# Patient Record
Sex: Female | Born: 1985 | Hispanic: No | Marital: Married | Smoking: Never smoker
Health system: Southern US, Community
[De-identification: ages and names within clinical notes are randomized; demographics above are authoritative.]

## PROBLEM LIST (undated history)

## (undated) ENCOUNTER — Inpatient Hospital Stay (HOSPITAL_COMMUNITY): Payer: Self-pay

## (undated) DIAGNOSIS — D649 Anemia, unspecified: Secondary | ICD-10-CM

## (undated) DIAGNOSIS — E041 Nontoxic single thyroid nodule: Secondary | ICD-10-CM

## (undated) DIAGNOSIS — K219 Gastro-esophageal reflux disease without esophagitis: Secondary | ICD-10-CM

## (undated) DIAGNOSIS — D571 Sickle-cell disease without crisis: Secondary | ICD-10-CM

## (undated) DIAGNOSIS — D473 Essential (hemorrhagic) thrombocythemia: Secondary | ICD-10-CM

## (undated) DIAGNOSIS — D75839 Thrombocytosis, unspecified: Secondary | ICD-10-CM

## (undated) DIAGNOSIS — O1210 Gestational proteinuria, unspecified trimester: Secondary | ICD-10-CM

## (undated) DIAGNOSIS — N9081 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, unspecified: Secondary | ICD-10-CM

## (undated) HISTORY — DX: Gestational proteinuria, unspecified trimester: O12.10

## (undated) HISTORY — PX: VAGINA SURGERY: SHX829

## (undated) HISTORY — DX: 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, unspecified: N90.810

## (undated) HISTORY — DX: Nontoxic single thyroid nodule: E04.1

---
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
## 2014-07-25 ENCOUNTER — Inpatient Hospital Stay (HOSPITAL_COMMUNITY)
Admission: AD | Admit: 2014-07-25 | Discharge: 2014-07-25 | Disposition: A | Discharge status: Home / Self Care | Payer: Medicaid Other | Source: Ambulatory Visit | Attending: Obstetrics & Gynecology | Admitting: Obstetrics & Gynecology

## 2014-07-25 ENCOUNTER — Encounter (HOSPITAL_COMMUNITY): Payer: Self-pay | Admitting: *Deleted

## 2014-07-25 DIAGNOSIS — Z3A12 12 weeks gestation of pregnancy: Secondary | ICD-10-CM | POA: Diagnosis not present

## 2014-07-25 DIAGNOSIS — O9989 Other specified diseases and conditions complicating pregnancy, childbirth and the puerperium: Secondary | ICD-10-CM | POA: Insufficient documentation

## 2014-07-25 DIAGNOSIS — K219 Gastro-esophageal reflux disease without esophagitis: Secondary | ICD-10-CM | POA: Insufficient documentation

## 2014-07-25 DIAGNOSIS — N949 Unspecified condition associated with female genital organs and menstrual cycle: Secondary | ICD-10-CM | POA: Insufficient documentation

## 2014-07-25 DIAGNOSIS — O99611 Diseases of the digestive system complicating pregnancy, first trimester: Secondary | ICD-10-CM | POA: Diagnosis not present

## 2014-07-25 DIAGNOSIS — R1030 Lower abdominal pain, unspecified: Secondary | ICD-10-CM | POA: Diagnosis present

## 2014-07-25 HISTORY — DX: Gastro-esophageal reflux disease without esophagitis: K21.9

## 2014-07-25 LAB — WET PREP, GENITAL
CLUE CELLS WET PREP: NONE SEEN
Trich, Wet Prep: NONE SEEN
WBC, Wet Prep HPF POC: NONE SEEN
Yeast Wet Prep HPF POC: NONE SEEN

## 2014-07-25 LAB — URINALYSIS, ROUTINE W REFLEX MICROSCOPIC
BILIRUBIN URINE: NEGATIVE
Glucose, UA: NEGATIVE mg/dL
KETONES UR: NEGATIVE mg/dL
Leukocytes, UA: NEGATIVE
NITRITE: NEGATIVE
Protein, ur: NEGATIVE mg/dL
Urobilinogen, UA: 0.2 mg/dL (ref 0.0–1.0)
pH: 5.5 (ref 5.0–8.0)

## 2014-07-25 LAB — URINE MICROSCOPIC-ADD ON

## 2014-07-25 NOTE — Discharge Instructions (Signed)
Round Ligament Pain During Pregnancy Round ligament pain is a sharp pain or jabbing feeling often felt in the lower belly or groin area on one or both sides. It is one of the most common complaints during pregnancy and is considered a normal part of pregnancy. It is most often felt during the second trimester.  Here is what you need to know about round ligament pain, including some tips to help you feel better.  Causes of Round Ligament Pain  Several thick ligaments surround and support your womb (uterus) as it grows during pregnancy. One of them is called the round ligament.  The round ligament connects the front part of the womb to your groin, the area where your legs attach to your pelvis. The round ligament normally tightens and relaxes slowly.  As your baby and womb grow, the round ligament stretches. That makes it more likely to become strained.  Sudden movements can cause the ligament to tighten quickly, like a rubber band snapping. This causes a sudden and quick jabbing feeling.  Symptoms of Round Ligament Pain  Round ligament pain can be concerning and uncomfortable. But it is considered normal as your body changes during pregnancy.  The symptoms of round ligament pain include a sharp, sudden spasm in the belly. It usually affects the right side, but it may happen on both sides. The pain only lasts a few seconds.  Exercise may cause the pain, as will rapid movements such as:  sneezing coughing laughing rolling over in bed standing up too quickly  Treatment of Round Ligament Pain  Here are some tips that may help reduce your discomfort:  Pain relief. Take over-the-counter acetaminophen for pain, if necessary. Ask your doctor if this is OK.  Exercise. Get plenty of exercise to keep your stomach (core) muscles strong. Doing stretching exercises or prenatal yoga can be helpful. Ask your doctor which exercises are safe for you and your baby.  A helpful exercise involves  putting your hands and knees on the floor, lowering your head, and pushing your backside into the air.  Avoid sudden movements. Change positions slowly (such as standing up or sitting down) to avoid sudden movements that may cause stretching and pain.  Flex your hips. Bend and flex your hips before you cough, sneeze, or laugh to avoid pulling on the ligaments.  Apply warmth. A heating pad or warm bath may be helpful. Ask your doctor if this is OK. Extreme heat can be dangerous to the baby.  You should try to modify your daily activity level and avoid positions that may worsen the condition.  When to Call the Doctor/Midwife  Always tell your doctor or midwife about any type of pain you have during pregnancy. Round ligament pain is quick and doesn't last long.  Call your health care provider immediately if you have:  severe pain fever chills pain on urination difficulty walking  Belly pain during pregnancy can be due to many different causes. It is important for your doctor to rule out more serious conditions, including pregnancy complications such as placenta abruption or non-pregnancy illnesses such as:  inguinal hernia appendicitis stomach, liver, and kidney problems Preterm labor pains may sometimes be mistaken for round ligament pain. First Trimester of Pregnancy The first trimester of pregnancy is from week 1 until the end of week 12 (months 1 through 3). A week after a sperm fertilizes an egg, the egg will implant on the wall of the uterus. This embryo will begin to develop into  a baby. Genes from you and your partner are forming the baby. The female genes determine whether the baby is a boy or a girl. At 6-8 weeks, the eyes and face are formed, and the heartbeat can be seen on ultrasound. At the end of 12 weeks, all the baby's organs are formed.  Now that you are pregnant, you will want to do everything you can to have a healthy baby. Two of the most important things are to get good  prenatal care and to follow your health care provider's instructions. Prenatal care is all the medical care you receive before the baby's birth. This care will help prevent, find, and treat any problems during the pregnancy and childbirth. BODY CHANGES Your body goes through many changes during pregnancy. The changes vary from woman to woman.   You may gain or lose a couple of pounds at first.  You may feel sick to your stomach (nauseous) and throw up (vomit). If the vomiting is uncontrollable, call your health care provider.  You may tire easily.  You may develop headaches that can be relieved by medicines approved by your health care provider.  You may urinate more often. Painful urination may mean you have a bladder infection.  You may develop heartburn as a result of your pregnancy.  You may develop constipation because certain hormones are causing the muscles that push waste through your intestines to slow down.  You may develop hemorrhoids or swollen, bulging veins (varicose veins).  Your breasts may begin to grow larger and become tender. Your nipples may stick out more, and the tissue that surrounds them (areola) may become darker.  Your gums may bleed and may be sensitive to brushing and flossing.  Dark spots or blotches (chloasma, mask of pregnancy) may develop on your face. This will likely fade after the baby is born.  Your menstrual periods will stop.  You may have a loss of appetite.  You may develop cravings for certain kinds of food.  You may have changes in your emotions from day to day, such as being excited to be pregnant or being concerned that something may go wrong with the pregnancy and baby.  You may have more vivid and strange dreams.  You may have changes in your hair. These can include thickening of your hair, rapid growth, and changes in texture. Some women also have hair loss during or after pregnancy, or hair that feels dry or thin. Your hair will  most likely return to normal after your baby is born. WHAT TO EXPECT AT YOUR PRENATAL VISITS During a routine prenatal visit:  You will be weighed to make sure you and the baby are growing normally.  Your blood pressure will be taken.  Your abdomen will be measured to track your baby's growth.  The fetal heartbeat will be listened to starting around week 10 or 12 of your pregnancy.  Test results from any previous visits will be discussed. Your health care provider may ask you:  How you are feeling.  If you are feeling the baby move.  If you have had any abnormal symptoms, such as leaking fluid, bleeding, severe headaches, or abdominal cramping.  If you have any questions. Other tests that may be performed during your first trimester include:  Blood tests to find your blood type and to check for the presence of any previous infections. They will also be used to check for low iron levels (anemia) and Rh antibodies. Later in the pregnancy, blood  tests for diabetes will be done along with other tests if problems develop.  Urine tests to check for infections, diabetes, or protein in the urine.  An ultrasound to confirm the proper growth and development of the baby.  An amniocentesis to check for possible genetic problems.  Fetal screens for spina bifida and Down syndrome.  You may need other tests to make sure you and the baby are doing well. HOME CARE INSTRUCTIONS  Medicines  Follow your health care provider's instructions regarding medicine use. Specific medicines may be either safe or unsafe to take during pregnancy.  Take your prenatal vitamins as directed.  If you develop constipation, try taking a stool softener if your health care provider approves. Diet  Eat regular, well-balanced meals. Choose a variety of foods, such as meat or vegetable-based protein, fish, milk and low-fat dairy products, vegetables, fruits, and whole grain breads and cereals. Your health care  provider will help you determine the amount of weight gain that is right for you.  Avoid raw meat and uncooked cheese. These carry germs that can cause birth defects in the baby.  Eating four or five small meals rather than three large meals a day may help relieve nausea and vomiting. If you start to feel nauseous, eating a few soda crackers can be helpful. Drinking liquids between meals instead of during meals also seems to help nausea and vomiting.  If you develop constipation, eat more high-fiber foods, such as fresh vegetables or fruit and whole grains. Drink enough fluids to keep your urine clear or pale yellow. Activity and Exercise  Exercise only as directed by your health care provider. Exercising will help you:  Control your weight.  Stay in shape.  Be prepared for labor and delivery.  Experiencing pain or cramping in the lower abdomen or low back is a good sign that you should stop exercising. Check with your health care provider before continuing normal exercises.  Try to avoid standing for long periods of time. Move your legs often if you must stand in one place for a long time.  Avoid heavy lifting.  Wear low-heeled shoes, and practice good posture.  You may continue to have sex unless your health care provider directs you otherwise. Relief of Pain or Discomfort  Wear a good support bra for breast tenderness.   Take warm sitz baths to soothe any pain or discomfort caused by hemorrhoids. Use hemorrhoid cream if your health care provider approves.   Rest with your legs elevated if you have leg cramps or low back pain.  If you develop varicose veins in your legs, wear support hose. Elevate your feet for 15 minutes, 3-4 times a day. Limit salt in your diet. Prenatal Care  Schedule your prenatal visits by the twelfth week of pregnancy. They are usually scheduled monthly at first, then more often in the last 2 months before delivery.  Write down your questions. Take  them to your prenatal visits.  Keep all your prenatal visits as directed by your health care provider. Safety  Wear your seat belt at all times when driving.  Make a list of emergency phone numbers, including numbers for family, friends, the hospital, and police and fire departments. General Tips  Ask your health care provider for a referral to a local prenatal education class. Begin classes no later than at the beginning of month 6 of your pregnancy.  Ask for help if you have counseling or nutritional needs during pregnancy. Your health care provider can  offer advice or refer you to specialists for help with various needs.  Do not use hot tubs, steam rooms, or saunas.  Do not douche or use tampons or scented sanitary pads.  Do not cross your legs for long periods of time.  Avoid cat litter boxes and soil used by cats. These carry germs that can cause birth defects in the baby and possibly loss of the fetus by miscarriage or stillbirth.  Avoid all smoking, herbs, alcohol, and medicines not prescribed by your health care provider. Chemicals in these affect the formation and growth of the baby.  Schedule a dentist appointment. At home, brush your teeth with a soft toothbrush and be gentle when you floss. SEEK MEDICAL CARE IF:   You have dizziness.  You have mild pelvic cramps, pelvic pressure, or nagging pain in the abdominal area.  You have persistent nausea, vomiting, or diarrhea.  You have a bad smelling vaginal discharge.  You have pain with urination.  You notice increased swelling in your face, hands, legs, or ankles. SEEK IMMEDIATE MEDICAL CARE IF:   You have a fever.  You are leaking fluid from your vagina.  You have spotting or bleeding from your vagina.  You have severe abdominal cramping or pain.  You have rapid weight gain or loss.  You vomit blood or material that looks like coffee grounds.  You are exposed to [name] measles and have never had  them.  You are exposed to fifth disease or chickenpox.  You develop a severe headache.  You have shortness of breath.  You have any kind of trauma, such as from a fall or a car accident. Document Released: 04/25/2001 Document Revised: 09/15/2013 Document Reviewed: 03/11/2013 [facility] Patient Information 2015 [place], [place]. This information is not intended to replace advice given to you by your health care provider. Make sure you discuss any questions you have with your health care provider.

## 2014-07-25 NOTE — MAU Note (Signed)
Having pain LLQ and into back since this morning. No bleeding.Has had 2 miscarriages in the past. Sharp pain that comes and goes. Sometimes goes down legs

## 2014-07-25 NOTE — MAU Provider Note (Signed)
Chief Complaint: Abdominal Pain   First Provider Initiated Contact with Patient 07/25/14 2002     SUBJECTIVE HPI: Joy Ferguson is a 29 y.o. [place] G3P0020 at [redacted]w[redacted]d by LMP who presents with onset this morning of left groin pain. It is associated with heavy sensation in left leg. The pain is constant but waxes and wanes. It is sharp at times. She has had dysuria and frequency but no urgency. At times has left LBP. Usually has 2 bowel movements a day and last bowel movement was yesterday. Denies straining at stool. No diarrhea.  Pregnancy Course: NPC. On OTC PNV. No bleeding or cramping. Language barrier.  Past Medical History  Diagnosis Date  . GERD (gastroesophageal reflux disease)    OB History  Gravida Para Term Preterm AB SAB TAB Ectopic Multiple Living  3    2 2         # Outcome Date GA Lbr Len/2nd Weight Sex Delivery Anes PTL Lv  3 Current           2 SAB           1 SAB              Past Surgical History  Procedure Laterality Date  . Vagina surgery     History   Social History  . Marital Status: Married    Spouse Name: N/A  . Number of Children: N/A  . Years of Education: N/A   Occupational History  . Not on file.   Social History Main Topics  . Smoking status: Never Smoker   . Smokeless tobacco: Not on file  . Alcohol Use: No  . Drug Use: No  . Sexual Activity: Yes   Other Topics Concern  . Not on file   Social History Narrative  . No narrative on file   No current facility-administered medications on file prior to encounter.   No current outpatient prescriptions on file prior to encounter.   No Known Allergies  ROS: Pertinent items in HPI  OBJECTIVE Blood pressure 111/64, pulse 87, temperature 98.2 F (36.8 C), resp. rate 18, height 5\' 3"  (1.6 m), weight 53.434 kg (117 lb 12.8 oz), last menstrual period 04/28/2014. GENERAL: Well-developed, well-nourished female in no acute distress.  HEENT: Normocephalic HEART: normal rate RESP: normal  effort ABDOMEN: BS normal. Soft, non-tender except minimal tenderness on deep palpation left groin. DT FHR 158 EXTREMITIES: Nontender, no edema NEURO: Alert and oriented SPECULUM EXAM: Female circumcision:, physiologic discharge, no blood noted, cervix clean BIMANUAL: cervix L/C; uterus 12-14 wk size, mid-position, mobile; no adnexal tenderness or masses noted  LAB RESULTS Results for orders placed or performed during the hospital encounter of 07/25/14 (from the past 24 hour(s))  Urinalysis, Routine w reflex microscopic     Status: Abnormal   Collection Time: 07/25/14  7:40 PM  Result Value Ref Range   Color, Urine YELLOW YELLOW   APPearance CLEAR CLEAR   Specific Gravity, Urine <1.005 (L) 1.005 - 1.030   pH 5.5 5.0 - 8.0   Glucose, UA NEGATIVE NEGATIVE mg/dL   Hgb urine dipstick TRACE (A) NEGATIVE   Bilirubin Urine NEGATIVE NEGATIVE   Ketones, ur NEGATIVE NEGATIVE mg/dL   Protein, ur NEGATIVE NEGATIVE mg/dL   Urobilinogen, UA 0.2 0.0 - 1.0 mg/dL   Nitrite NEGATIVE NEGATIVE   Leukocytes, UA NEGATIVE NEGATIVE  Urine microscopic-add on     Status: None   Collection Time: 07/25/14  7:40 PM  Result Value Ref Range   Squamous Epithelial /  LPF RARE RARE   WBC, UA 0-2 <3 WBC/hpf   RBC / HPF 0-2 <3 RBC/hpf   Bacteria, UA RARE RARE  Wet prep, genital     Status: None   Collection Time: 07/25/14  8:14 PM  Result Value Ref Range   Yeast Wet Prep HPF POC NONE SEEN NONE SEEN   Trich, Wet Prep NONE SEEN NONE SEEN   Clue Cells Wet Prep HPF POC NONE SEEN NONE SEEN   WBC, Wet Prep HPF POC NONE SEEN NONE SEEN    IMAGING No results found.  MAU COURSE GC/CT sent  ASSESSMENT 1. Round ligament pain   G3P0020 at [redacted]w[redacted]d pregnancy  PLAN Discharge home with AVS on RLP and pregnancy precautions, increase fluids Pregnancy verification letter and information on MPW    Medication List    TAKE these medications        prenatal multivitamin Tabs tablet  Take 1 tablet by mouth daily at 12  noon.       Follow-up Information    Follow up with [facility] [place]. Schedule an appointment as soon as possible for a visit in 1 week.   Contact information:   [place] [id number] [address], CNM 07/25/2014  8:17 PM

## 2014-07-26 LAB — HIV ANTIBODY (ROUTINE TESTING W REFLEX): HIV SCREEN 4TH GENERATION: NONREACTIVE

## 2014-07-27 LAB — GC/CHLAMYDIA PROBE AMP (~~LOC~~) NOT AT ARMC
Chlamydia: NEGATIVE
Neisseria Gonorrhea: NEGATIVE

## 2014-08-25 ENCOUNTER — Telehealth: Payer: Self-pay | Admitting: Internal Medicine

## 2014-08-25 ENCOUNTER — Telehealth: Payer: Self-pay | Admitting: Hematology and Oncology

## 2014-08-25 NOTE — Telephone Encounter (Signed)
NEW PATIENT APPT- S/W PATIENT AND GVE NP APPT FOR 05/10 @ 12:30 W/DR. GUDENA. REFERRING DR. Lorriane Shire HAYGOOD DX- ANEMIA; ELEVATED PLTS, ABN HGB

## 2014-08-25 NOTE — Telephone Encounter (Signed)
NEW PATIENT APPT-LEFT MESSAGE FOR PATIENT HUSBAND TO RETURN CALL TO SCHEDULE NP APPT

## 2014-09-24 ENCOUNTER — Encounter: Payer: Self-pay | Admitting: Hematology and Oncology

## 2014-09-24 ENCOUNTER — Ambulatory Visit: Payer: Medicaid Other

## 2014-09-24 ENCOUNTER — Encounter (INDEPENDENT_AMBULATORY_CARE_PROVIDER_SITE_OTHER): Payer: Self-pay

## 2014-09-24 ENCOUNTER — Other Ambulatory Visit: Payer: Self-pay | Admitting: *Deleted

## 2014-09-24 ENCOUNTER — Ambulatory Visit (HOSPITAL_BASED_OUTPATIENT_CLINIC_OR_DEPARTMENT_OTHER): Payer: Medicaid Other | Admitting: Hematology and Oncology

## 2014-09-24 ENCOUNTER — Telehealth: Payer: Self-pay | Admitting: Hematology and Oncology

## 2014-09-24 VITALS — BP 110/64 | HR 98 | Temp 98.2°F | Resp 18 | Ht 63.0 in | Wt 124.6 lb

## 2014-09-24 DIAGNOSIS — D571 Sickle-cell disease without crisis: Secondary | ICD-10-CM

## 2014-09-24 NOTE — Progress Notes (Signed)
Patient Care Team: No Pcp Per Patient as PCP - General (General Practice)  DIAGNOSIS: anemia evaluation  CHIEF COMPLIANT: pregnancy with anemia  INTERVAL HISTORY: Joy Ferguson is a 29 year old lady who is pregnant at [redacted] weeks and was diagnosed to have anemia with a hemoglobin of 8.3. She is originally from [place] and had previously lost 2 pregnancies both in the first month.she is now pregnant third time with healthy baby at [redacted] weeks gestation. She had blood work on 08/19/2014 that showed a hemoglobin of 8.3 and a platelet count of 552. She was referred to [name] for further evaluation. She had a hemoglobin of her phoresis which revealed that she has hemoglobin SS. But the patient is not aware that this signifies that she has sickle cell disease. She complains of pain in her bones especially the lower extremity for a long time. There are several members in her family were told to have anemia but never found out whether they had sickle cell anemia.  REVIEW OF SYSTEMS:   Constitutional: Denies fevers, chills or abnormal weight loss Eyes: Denies blurriness of vision Ears, nose, mouth, throat, and face: Denies mucositis or sore throat Respiratory: Denies cough, dyspnea or wheezes Cardiovascular: Denies palpitation, chest discomfort or lower extremity swelling Gastrointestinal:  [redacted] weeks gestation Skin: Denies abnormal skin rashes Lymphatics: Denies new lymphadenopathy or easy bruising Neurological:Denies numbness, tingling or new weaknesses Behavioral/Psych: Mood is stable, no new changes  Bone pain All other systems were reviewed with the patient and are negative.  I have reviewed the past medical history, past surgical history, social history and family history with the patient and they are unchanged from previous note.  ALLERGIES:  has No Known Allergies.  MEDICATIONS:  Current Outpatient Prescriptions  Medication Sig Dispense Refill  . Prenatal Vit-Fe Fumarate-FA (PRENATAL MULTIVITAMIN) TABS  tablet Take 1 tablet by mouth daily at 12 noon.     No current facility-administered medications for this visit.    PHYSICAL EXAMINATION: ECOG PERFORMANCE STATUS: 1 - Symptomatic but completely ambulatory  Filed Vitals:   09/24/14 1527  BP: 110/64  Pulse: 98  Temp: 98.2 F (36.8 C)  Resp: 18   Filed Weights   09/24/14 1527  Weight: 124 lb 9.6 oz (56.518 kg)    GENERAL:alert, no distress and comfortable SKIN: skin color, texture, turgor are normal, no rashes or significant lesions EYES: normal, Conjunctiva are pink and non-injected, sclera clear OROPHARYNX:no exudate, no erythema and lips, buccal mucosa, and tongue normal  NECK: supple, thyroid normal size, non-tender, without nodularity LYMPH:  no palpable lymphadenopathy in the cervical, axillary or inguinal LUNGS: clear to auscultation and percussion with normal breathing effort HEART: regular rate & rhythm and no murmurs and no lower extremity edema ABDOMEN:abdomen soft, non-tender and normal bowel sounds Musculoskeletal:no cyanosis of digits and no clubbing  NEURO: alert & oriented x 3 with fluent speech, no focal motor/sensory deficits LABORATORY DATA:  From Ob office hemoglobin 8.3, normal MCV 88.2, hemoglobin electrophoresis showing  HbA: 0% Hb A2: 3% HbF: 12.8%  HbS: 84.2%  ASSESSMENT & PLAN:  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 I discussed with her the mechanism of abnormal hemoglobin SS and how it causes sickling of red cells and hemolysis which can lead to anemia. We also discussed the complications of sickle cell anemia.  However I would like to rule out other causes  of her anemia including iron deficiency, Z-36, folic acid.  If these levels are normal, there is no need to replace iron and I will let her follow with sickle cell clinic for the remainder of  the pregnancy.  Plan:  1. Referral to Dr. Liston Alba who is our regional expert in sickle cell disease. 2. CBC with differential line studies U-44 folic acid, haptoglobin LDH and CMP    Orders Placed This Encounter  Procedures  . CBC & Diff and Retic    Standing Status: Future     Number of Occurrences:      Standing Expiration Date: 09/24/2015  . Ferritin    Standing Status: Future     Number of Occurrences:      Standing Expiration Date: 09/24/2015  . Iron and TIBC [facility]    Standing Status: Future     Number of Occurrences:      Standing Expiration Date: 09/24/2015  . Folate RBC    Standing Status: Future     Number of Occurrences:      Standing Expiration Date: 09/24/2015  . Vitamin B12    Standing Status: Future     Number of Occurrences:      Standing Expiration Date: 09/24/2015  . Haptoglobin    Standing Status: Future     Number of Occurrences:      Standing Expiration Date: 09/24/2015  . Lactate dehydrogenase (LDH) - [facility]    Standing Status: Future     Number of Occurrences:      Standing Expiration Date: 09/24/2015  . Comprehensive metabolic panel (Cmet) - [facility]    Standing Status: Future     Number of Occurrences:      Standing Expiration Date: 09/24/2015   The patient has a good understanding of the overall plan. she agrees with it. she will call with any problems that may develop before the next visit here.   Rulon Eisenmenger, MD

## 2014-09-24 NOTE — Assessment & Plan Note (Addendum)
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 I discussed with her the mechanism of abnormal hemoglobin SS and how it causes sickling of red cells and hemolysis which can lead to anemia.  However I would like to rule out other causes of her anemia including iron deficiency, G-89, folic acid.  If these levels are normal, there is no need to replace iron. I will let her follow with sickle cell clinic for the remainder of the pregnancy.  Plan: Referral to Dr. Liston Alba

## 2014-09-24 NOTE — Progress Notes (Signed)
Checked in new pt with no financial concerns.  Pt's insurance should pay at 100% but she has my card for any billing questions or concerns.

## 2014-09-24 NOTE — Telephone Encounter (Signed)
Appointments made and avs printed for patient °

## 2014-09-25 ENCOUNTER — Other Ambulatory Visit (HOSPITAL_BASED_OUTPATIENT_CLINIC_OR_DEPARTMENT_OTHER): Payer: Medicaid Other

## 2014-09-25 ENCOUNTER — Ambulatory Visit: Payer: Medicaid Other | Admitting: Hematology and Oncology

## 2014-09-25 DIAGNOSIS — D571 Sickle-cell disease without crisis: Secondary | ICD-10-CM

## 2014-09-25 LAB — CBC & DIFF AND RETIC
BASO%: 0.7 % (ref 0.0–2.0)
Basophils Absolute: 0.1 10*3/uL (ref 0.0–0.1)
EOS%: 2.4 % (ref 0.0–7.0)
Eosinophils Absolute: 0.4 10*3/uL (ref 0.0–0.5)
HCT: 25.2 % — ABNORMAL LOW (ref 34.8–46.6)
HGB: 8.4 g/dL — ABNORMAL LOW (ref 11.6–15.9)
Immature Retic Fract: 21.8 % — ABNORMAL HIGH (ref 1.60–10.00)
LYMPH%: 12.3 % — ABNORMAL LOW (ref 14.0–49.7)
MCH: 30 pg (ref 25.1–34.0)
MCHC: 33.3 g/dL (ref 31.5–36.0)
MCV: 90 fL (ref 79.5–101.0)
MONO#: 1.3 10*3/uL — AB (ref 0.1–0.9)
MONO%: 8.4 % (ref 0.0–14.0)
NEUT#: 11.6 10*3/uL — ABNORMAL HIGH (ref 1.5–6.5)
NEUT%: 76.2 % (ref 38.4–76.8)
Platelets: 510 10*3/uL — ABNORMAL HIGH (ref 145–400)
RBC: 2.8 10*6/uL — AB (ref 3.70–5.45)
RDW: 18.3 % — ABNORMAL HIGH (ref 11.2–14.5)
Retic %: 18.19 % — ABNORMAL HIGH (ref 0.70–2.10)
Retic Ct Abs: 509.32 10*3/uL — ABNORMAL HIGH (ref 33.70–90.70)
WBC: 15.2 10*3/uL — ABNORMAL HIGH (ref 3.9–10.3)
lymph#: 1.9 10*3/uL (ref 0.9–3.3)
nRBC: 11 % — ABNORMAL HIGH (ref 0–0)

## 2014-09-25 LAB — TECHNOLOGIST REVIEW

## 2014-09-25 LAB — COMPREHENSIVE METABOLIC PANEL (CC13)
ALBUMIN: 3 g/dL — AB (ref 3.5–5.0)
ALK PHOS: 106 U/L (ref 40–150)
ALT: 37 U/L (ref 0–55)
ANION GAP: 11 meq/L (ref 3–11)
AST: 39 U/L — AB (ref 5–34)
BUN: 4.1 mg/dL — AB (ref 7.0–26.0)
CALCIUM: 9.1 mg/dL (ref 8.4–10.4)
CHLORIDE: 103 meq/L (ref 98–109)
CO2: 22 mEq/L (ref 22–29)
CREATININE: 0.5 mg/dL — AB (ref 0.6–1.1)
EGFR: >90 mL/min/{1.73_m2} (ref >90)
GLUCOSE: 83 mg/dL (ref 70–140)
POTASSIUM: 3.6 meq/L (ref 3.5–5.1)
SODIUM: 136 meq/L (ref 136–145)
Total Bilirubin: 1.28 mg/dL — ABNORMAL HIGH (ref 0.20–1.20)
Total Protein: 6.9 g/dL (ref 6.4–8.3)

## 2014-09-25 LAB — LACTATE DEHYDROGENASE (CC13): LDH: 212 U/L (ref 125–245)

## 2014-09-27 LAB — VITAMIN B12: Vitamin B-12: 536 pg/mL (ref 211–911)

## 2014-09-27 LAB — HAPTOGLOBIN: Haptoglobin: <15 mg/dL — ABNORMAL LOW (ref 43–212)

## 2014-09-27 LAB — FOLATE RBC: RBC Folate: 1339 ng/mL

## 2014-09-28 LAB — FERRITIN CHCC: FERRITIN: 538 ng/mL — AB (ref 9–269)

## 2014-09-28 LAB — IRON AND TIBC CHCC
%SAT: 36 % (ref 21–57)
Iron: 86 ug/dL (ref 41–142)
TIBC: 236 ug/dL (ref 236–444)
UIBC: 151 ug/dL (ref 120–384)

## 2014-09-30 ENCOUNTER — Other Ambulatory Visit: Payer: Self-pay

## 2014-09-30 ENCOUNTER — Telehealth: Payer: Self-pay

## 2014-09-30 NOTE — Telephone Encounter (Signed)
LMOVM - iron studies came back normal.  Confirming patient went to sickle cell center to release records so that Dr. Zigmund Daniel can begin to review her case.  Patient to call clinic if she has any questions.    Sent inbasket to Dr. Zigmund Daniel advising of referral to [facility]

## 2014-10-16 ENCOUNTER — Telehealth: Payer: Self-pay | Admitting: *Deleted

## 2014-10-16 NOTE — Telephone Encounter (Signed)
Received call from [place] that referral to sickle cell was not made. During patient office visit here, it was discussed with patient and patient's husband that they needed to go to the sickle cell clinic and sign a records release. Dr. Rodena Piety would then review records and decide whether to accept her as a new patient. Patient did not do this according to sickle cell clinic. Spoke with patient today and gave sickle cell clinic address and phone #. Instructed her to go there between 8-4:30 any day to do this. Patient does speak [place] but unsure if she fully understood. Also called husband's cell and left message to call us so that he could be given instructions to complete the referral process.

## 2014-11-18 ENCOUNTER — Inpatient Hospital Stay (HOSPITAL_COMMUNITY)
Admission: AD | Admit: 2014-11-18 | Discharge: 2014-11-18 | Disposition: A | Discharge status: Home / Self Care | Payer: Medicaid Other | Source: Ambulatory Visit | Attending: Obstetrics and Gynecology | Admitting: Obstetrics and Gynecology

## 2014-11-18 ENCOUNTER — Encounter (HOSPITAL_COMMUNITY): Payer: Self-pay | Admitting: *Deleted

## 2014-11-18 DIAGNOSIS — O99013 Anemia complicating pregnancy, third trimester: Secondary | ICD-10-CM | POA: Insufficient documentation

## 2014-11-18 DIAGNOSIS — Z3A29 29 weeks gestation of pregnancy: Secondary | ICD-10-CM | POA: Insufficient documentation

## 2014-11-18 DIAGNOSIS — M25551 Pain in right hip: Secondary | ICD-10-CM | POA: Diagnosis present

## 2014-11-18 DIAGNOSIS — R109 Unspecified abdominal pain: Secondary | ICD-10-CM | POA: Insufficient documentation

## 2014-11-18 DIAGNOSIS — M79606 Pain in leg, unspecified: Secondary | ICD-10-CM | POA: Insufficient documentation

## 2014-11-18 DIAGNOSIS — D571 Sickle-cell disease without crisis: Secondary | ICD-10-CM | POA: Diagnosis not present

## 2014-11-18 LAB — CBC
HEMATOCRIT: 24.6 % — AB (ref 36.0–46.0)
Hemoglobin: 8.4 g/dL — ABNORMAL LOW (ref 12.0–15.0)
MCH: 30.8 pg (ref 26.0–34.0)
MCHC: 34.1 g/dL (ref 30.0–36.0)
MCV: 90.1 fL (ref 78.0–100.0)
PLATELETS: 432 10*3/uL — AB (ref 150–400)
RBC: 2.73 MIL/uL — ABNORMAL LOW (ref 3.87–5.11)
RDW: 22 % — AB (ref 11.5–15.5)
WBC: 14.5 10*3/uL — AB (ref 4.0–10.5)

## 2014-11-18 LAB — URINE MICROSCOPIC-ADD ON: WBC UA: NONE SEEN WBC/hpf (ref <3)

## 2014-11-18 LAB — URINALYSIS, ROUTINE W REFLEX MICROSCOPIC
Bilirubin Urine: NEGATIVE
Glucose, UA: NEGATIVE mg/dL
Ketones, ur: NEGATIVE mg/dL
Leukocytes, UA: NEGATIVE
Nitrite: NEGATIVE
PROTEIN: NEGATIVE mg/dL
Specific Gravity, Urine: <1.005 — ABNORMAL LOW (ref 1.005–1.030)
UROBILINOGEN UA: 0.2 mg/dL (ref 0.0–1.0)
pH: 5.5 (ref 5.0–8.0)

## 2014-11-18 LAB — RETICULOCYTES
RBC.: 2.73 MIL/uL — ABNORMAL LOW (ref 3.87–5.11)
RETIC CT PCT: 17.4 % — AB (ref 0.4–3.1)
Retic Count, Absolute: 475 10*3/uL — ABNORMAL HIGH (ref 19.0–186.0)

## 2014-11-18 MED ORDER — OXYCODONE-ACETAMINOPHEN 5-325 MG PO TABS
1.0000 | ORAL_TABLET | Freq: Once | ORAL | Status: AC
  Administered 2014-11-18: 1 via ORAL
  Filled 2014-11-18: qty 1

## 2014-11-18 MED ORDER — SODIUM CHLORIDE 0.9 % IV SOLN
INTRAVENOUS | Status: DC
  Administered 2014-11-18: 19:00:00 via INTRAVENOUS

## 2014-11-18 MED ORDER — SODIUM CHLORIDE 0.9 % IV BOLUS (SEPSIS)
500.0000 mL | Freq: Once | INTRAVENOUS | Status: AC
  Administered 2014-11-18: 500 mL via INTRAVENOUS

## 2014-11-18 NOTE — MAU Provider Note (Signed)
  History   29 yo G3P0020 at 81 1/7 weeks presented from the office for further evaluation of pain in right hip and leg, and mild cramping.  Patient presented to office unannounced c/o onset of pain in right hip and leg this afternoon, along with mild cramping, "like a period".  Patient with newly dx [place] disease on NOB labs, but no clear history of any crises in past.  Has been seen by Hematology, and referred to [facility], but has not been given an appt there yet.    Upon discussion with patient's husband, he reports patient has had episodes of this hip and leg pain while in [place] (even prior to pregnancy), but has never required any treatment for it.   Seen by Ree Edman, CNM, at [facility] today, with cervix closed and long, with FFN sent around 5pm. She consulted with Dr. Charlesetta Garibaldi, who directed her to send the patient to MAU.  Patient Active Problem List   Diagnosis Date Noted  . Sickle cell anemia 09/24/2014    Chief Complaint  Patient presents with  . Abdominal Pain  . Hip Pain   HPI:  See above  OB History    Gravida Para Term Preterm AB TAB SAB Ectopic Multiple Living   3    2  2          Past Medical History  Diagnosis Date  . GERD (gastroesophageal reflux disease)     Past Surgical History  Procedure Laterality Date  . Vagina surgery      No family history on file.  History  Substance Use Topics  . Smoking status: Never Smoker   . Smokeless tobacco: Not on file  . Alcohol Use: No    Allergies: No Known Allergies  Prescriptions prior to admission  Medication Sig Dispense Refill Last Dose  . Prenatal Vit-Fe Fumarate-FA (PRENATAL MULTIVITAMIN) TABS tablet Take 1 tablet by mouth daily at 12 noon.   Taking    ROS:  Pain in right hip and leg, mild cramping, +FM Physical Exam   Blood pressure 104/59, pulse 94, temperature 98.4 F (36.9 C), temperature source Oral, resp. rate 16, weight 61.054 kg (134 lb 9.6 oz), last menstrual period  04/28/2014.    Physical Exam In NAD Chest clear Heart RRR without murmur Abd gravid, NT Pelvic--closed and long at office visit Ext--Full ROM both lower extremities, no obvious swelling noted.  DTR 1+, trace edema bilaterally.  Mild pain on knee and hip flexion, but no resistance to movement. Negative Homan's bilaterally.  FHR reassuring, 10 beat accels, baseline 160s Mild irritability, single defined UC   ED Course  Assessment: IUP t 29 1/7 weeks Sickle cell disease Right hip/leg pain Uterine cramping--FFN done in office  Plan: IV hydration Percocet CBC, retic count FFN pending from office Farrel Gordon, CNM, will f/u on patient results Plan for scheduling Doppler flow studies of right leg as an outpatient.   Donnel Saxon CNM, MSN 11/18/2014 6:36 PM

## 2014-11-18 NOTE — MAU Note (Signed)
Pain in abd, cramping like period.   Pain in hip,down into leg

## 2014-11-18 NOTE — Progress Notes (Signed)
S: In to evaluate. Anxious to go home as she is feeling much better s/p IVFs and Percocet tab.  O:   Today's Vitals   11/18/14 1844 11/18/14 1920 11/18/14 1942 11/18/14 1957  BP: 122/67   103/52  Pulse: 101   93  Temp:    97.9 F (36.6 C)  TempSrc:    Oral  Resp:    16  Weight:      PainSc: 6  6  1        Results for orders placed or performed during the hospital encounter of 11/18/14 (from the past 24 hour(s))  Reticulocytes     Status: Abnormal   Collection Time: 11/18/14  6:40 PM  Result Value Ref Range   Retic Ct Pct 17.4 (H) 0.4 - 3.1 %   RBC. 2.73 (L) 3.87 - 5.11 MIL/uL   Retic Count, Manual 475.0 (H) 19.0 - 186.0 K/uL  CBC     Status: Abnormal   Collection Time: 11/18/14  6:40 PM  Result Value Ref Range   WBC 14.5 (H) 4.0 - 10.5 K/uL   RBC 2.73 (L) 3.87 - 5.11 MIL/uL   Hemoglobin 8.4 (L) 12.0 - 15.0 g/dL   HCT 24.6 (L) 36.0 - 46.0 %   MCV 90.1 78.0 - 100.0 fL   MCH 30.8 26.0 - 34.0 pg   MCHC 34.1 30.0 - 36.0 g/dL   RDW 22.0 (H) 11.5 - 15.5 %   Platelets 432 (H) 150 - 400 K/uL  Urinalysis, Routine w reflex microscopic (not at [facility])     Status: Abnormal   Collection Time: 11/18/14  6:44 PM  Result Value Ref Range   Color, Urine YELLOW YELLOW   APPearance CLEAR CLEAR   Specific Gravity, Urine <1.005 (L) 1.005 - 1.030   pH 5.5 5.0 - 8.0   Glucose, UA NEGATIVE NEGATIVE mg/dL   Hgb urine dipstick TRACE (A) NEGATIVE   Bilirubin Urine NEGATIVE NEGATIVE   Ketones, ur NEGATIVE NEGATIVE mg/dL   Protein, ur NEGATIVE NEGATIVE mg/dL   Urobilinogen, UA 0.2 0.0 - 1.0 mg/dL   Nitrite NEGATIVE NEGATIVE   Leukocytes, UA NEGATIVE NEGATIVE  Urine microscopic-add on     Status: Abnormal   Collection Time: 11/18/14  6:44 PM  Result Value Ref Range   Squamous Epithelial / LPF RARE RARE   WBC, UA  <3 WBC/hpf    NO FORMED ELEMENTS SEEN ON URINE MICROSCOPIC EXAMINATION   RBC / HPF 0-2 <3 RBC/hpf   Bacteria, UA FEW (A) RARE     FHRT: BL 150 w/ mod variability, +accels ---  10x10, no decels Rare u/cs  A: Aches and pains of pregnancy.  Pain responded to IVFs and Percocet. Sickle cell disease, no crisis (elevated retic count c/w disease).  Reassuring fetal testing. Thrombocytosis.  P: Strict PTL precautions. Continue daily fetal movement counts per protocol. To have doppler flow study as an outpt. Hematology referral already placed. Dr. Simona Huh updated.   Farrel Gordon, CNM 11/18/14, 8:32 PM

## 2014-11-20 ENCOUNTER — Encounter (HOSPITAL_COMMUNITY): Payer: Medicaid Other

## 2014-11-20 ENCOUNTER — Other Ambulatory Visit: Payer: Self-pay | Admitting: Obstetrics and Gynecology

## 2014-11-20 ENCOUNTER — Ambulatory Visit (HOSPITAL_COMMUNITY)
Admission: RE | Admit: 2014-11-20 | Discharge: 2014-11-20 | Disposition: A | Discharge status: Home / Self Care | Payer: Medicaid Other | Source: Ambulatory Visit | Attending: Cardiology | Admitting: Cardiology

## 2014-11-20 DIAGNOSIS — M79604 Pain in right leg: Secondary | ICD-10-CM

## 2014-12-26 ENCOUNTER — Inpatient Hospital Stay (HOSPITAL_COMMUNITY)
Admission: AD | Admit: 2014-12-26 | Discharge: 2014-12-26 | Disposition: A | Discharge status: Other Acute Inpatient Hospital | Payer: Medicaid Other | Source: Ambulatory Visit | Attending: Obstetrics and Gynecology | Admitting: Obstetrics and Gynecology

## 2014-12-26 ENCOUNTER — Encounter (HOSPITAL_COMMUNITY): Payer: Self-pay | Admitting: *Deleted

## 2014-12-26 DIAGNOSIS — O99013 Anemia complicating pregnancy, third trimester: Secondary | ICD-10-CM | POA: Diagnosis not present

## 2014-12-26 DIAGNOSIS — O42913 Preterm premature rupture of membranes, unspecified as to length of time between rupture and onset of labor, third trimester: Secondary | ICD-10-CM | POA: Insufficient documentation

## 2014-12-26 DIAGNOSIS — Z3A34 34 weeks gestation of pregnancy: Secondary | ICD-10-CM | POA: Diagnosis not present

## 2014-12-26 DIAGNOSIS — D573 Sickle-cell trait: Secondary | ICD-10-CM | POA: Insufficient documentation

## 2014-12-26 DIAGNOSIS — O9989 Other specified diseases and conditions complicating pregnancy, childbirth and the puerperium: Secondary | ICD-10-CM | POA: Diagnosis present

## 2014-12-26 HISTORY — DX: Sickle-cell disease without crisis: D57.1

## 2014-12-26 LAB — URINALYSIS, ROUTINE W REFLEX MICROSCOPIC
Glucose, UA: NEGATIVE mg/dL
Ketones, ur: NEGATIVE mg/dL
Leukocytes, UA: NEGATIVE
Nitrite: NEGATIVE
PROTEIN: NEGATIVE mg/dL
SPECIFIC GRAVITY, URINE: 1.01 (ref 1.005–1.030)
Urobilinogen, UA: 1 mg/dL (ref 0.0–1.0)
pH: 5.5 (ref 5.0–8.0)

## 2014-12-26 LAB — POCT FERN TEST: POCT Fern Test: POSITIVE

## 2014-12-26 LAB — URINE MICROSCOPIC-ADD ON: RBC / HPF: NONE SEEN RBC/hpf (ref <3)

## 2014-12-26 LAB — GROUP B STREP BY PCR: GROUP B STREP BY PCR: NEGATIVE

## 2014-12-26 MED ORDER — LACTATED RINGERS IV SOLN
INTRAVENOUS | Status: DC
  Administered 2014-12-26: 05:00:00 via INTRAVENOUS

## 2014-12-26 MED ORDER — NALBUPHINE HCL 10 MG/ML IJ SOLN
10.0000 mg | INTRAMUSCULAR | Status: DC | PRN
  Administered 2014-12-26: 10 mg via INTRAVENOUS
  Filled 2014-12-26: qty 1

## 2014-12-26 NOTE — MAU Provider Note (Signed)
History    Joy Ferguson is a 29 y.o. G3P0020 at 34.4wks who presents, after phone call, for pain.  Patient states that she has been experiencing pain for the past 3 days and it has progressively worsened this evening.  Patient also reports that ongoing leakage of fluid, but noticed an increase about 3 days ago.  Patient states fluid is clear and watery in consistency.  Patient denies VB and reports active fetus.    Patient Active Problem List   Diagnosis Date Noted  . Sickle cell anemia 09/24/2014    Chief Complaint  Patient presents with  . Back Pain   HPI  OB History    Gravida Para Term Preterm AB TAB SAB Ectopic Multiple Living   3    2  2          Past Medical History  Diagnosis Date  . GERD (gastroesophageal reflux disease)     Past Surgical History  Procedure Laterality Date  . Vagina surgery      History reviewed. No pertinent family history.  Social History  Substance Use Topics  . Smoking status: Never Smoker   . Smokeless tobacco: None  . Alcohol Use: No    Allergies: No Known Allergies  Prescriptions prior to admission  Medication Sig Dispense Refill Last Dose  . Prenatal Vit-Fe Fumarate-FA (PRENATAL MULTIVITAMIN) TABS tablet Take 1 tablet by mouth daily at 12 noon.   11/18/2014 at Unknown time    ROS  See HPI Above Physical Exam   Blood pressure 92/55, pulse 98, temperature 98.2 F (36.8 C), resp. rate 18, height 5\' 4"  (1.626 m), weight 60.601 kg (133 lb 9.6 oz), last menstrual period 04/28/2014.  Results for orders placed or performed during the hospital encounter of 12/26/14 (from the past 24 hour(s))  Fern Test     Status: None   Collection Time: 12/26/14  5:38 AM  Result Value Ref Range   POCT Fern Test Positive = ruptured amniotic membanes    Physical Exam  Constitutional: She is oriented to person, place, and time. She appears well-developed and well-nourished. She appears distressed.  HENT:  Head: Normocephalic and atraumatic.  Eyes: EOM  are normal. Pupils are equal, round, and reactive to light.  Neck: Normal range of motion.  Cardiovascular: Normal rate, regular rhythm and normal heart sounds.   Respiratory: Effort normal and breath sounds normal.  GI: Soft. Bowel sounds are normal.  Genitourinary: Vaginal discharge found.  Sterile Speculum Exam: -Vaginal Vault: Moderate amt watery fluid noted in vault-fern & wet prep collected -Cervix: Not able to visualize os Bimanual Exam: 3/70/-1  Musculoskeletal: Normal range of motion. She exhibits edema.  Neurological: She is alert and oriented to person, place, and time.  Skin: Skin is warm and dry.    FHR:150 bpm, Mod Var, -Decels, +Accels UC: Q3-55min, palpates moderate ED Course  Assessment: IUP at 34.4wks Cat I FT Early Active Labor PPROM-Unknown Rupture Time Sickle Cell Disease  Plan: -PE as above -Labs: Fern Positive, UA Pending -Start IV with LR infusion -Give Nubain 10mg  IV now for pain -Dr. Marney Doctor consulted and advised that patient should be transferred due to NICU acuity -Dr. Octavio Manns consulted and advised as below; -Rapid GBS now  Follow Up (0530) -Dr. Jolyn Lent at [facility] contacted and provided with patient information--Accepts transfer with no additional orders given -Patient and husband informed of transfer status, verbalizes understanding -Questions and concerns addressed  Follow Up (0558) -[facility] on site for patient transfer -EMTALA  completed -Husband updated on patient status and POC-encouraged to keep [name] updated -Transferred to [facility] in stable condition   [facility], Joy Schrupp Sloan Leiter, MSN 12/26/2014 4:44 AM

## 2014-12-26 NOTE — MAU Note (Signed)
Report called to [facility] L&D.  Patient to be transferred via [place].

## 2014-12-26 NOTE — MAU Note (Signed)
Back pain and abd pain since yesterday. Worse now for past 3 hours. Denies LOF or bleeding

## 2015-01-01 DIAGNOSIS — J189 Pneumonia, unspecified organism: Secondary | ICD-10-CM | POA: Diagnosis present

## 2015-01-01 DIAGNOSIS — D509 Iron deficiency anemia, unspecified: Secondary | ICD-10-CM | POA: Insufficient documentation

## 2015-01-01 DIAGNOSIS — IMO0001 Reserved for inherently not codable concepts without codable children: Secondary | ICD-10-CM | POA: Insufficient documentation

## 2015-01-01 DIAGNOSIS — R160 Hepatomegaly, not elsewhere classified: Secondary | ICD-10-CM | POA: Insufficient documentation

## 2015-01-07 ENCOUNTER — Encounter: Payer: Self-pay | Admitting: Obstetrics and Gynecology

## 2015-01-07 DIAGNOSIS — D5701 Hb-SS disease with acute chest syndrome: Secondary | ICD-10-CM | POA: Insufficient documentation

## 2015-01-14 ENCOUNTER — Emergency Department (HOSPITAL_COMMUNITY): Payer: Medicaid Other

## 2015-01-14 ENCOUNTER — Encounter (HOSPITAL_COMMUNITY): Payer: Self-pay | Admitting: *Deleted

## 2015-01-14 ENCOUNTER — Inpatient Hospital Stay (HOSPITAL_COMMUNITY)
Admission: EM | Admit: 2015-01-14 | Discharge: 2015-01-16 | DRG: 193 | Disposition: A | Discharge status: Home / Self Care | Payer: Medicaid Other | Attending: Internal Medicine | Admitting: Internal Medicine

## 2015-01-14 DIAGNOSIS — R1011 Right upper quadrant pain: Secondary | ICD-10-CM | POA: Diagnosis not present

## 2015-01-14 DIAGNOSIS — Z8249 Family history of ischemic heart disease and other diseases of the circulatory system: Secondary | ICD-10-CM | POA: Diagnosis not present

## 2015-01-14 DIAGNOSIS — D473 Essential (hemorrhagic) thrombocythemia: Secondary | ICD-10-CM | POA: Diagnosis present

## 2015-01-14 DIAGNOSIS — Y95 Nosocomial condition: Secondary | ICD-10-CM | POA: Diagnosis present

## 2015-01-14 DIAGNOSIS — J189 Pneumonia, unspecified organism: Secondary | ICD-10-CM | POA: Diagnosis present

## 2015-01-14 DIAGNOSIS — D5700 Hb-SS disease with crisis, unspecified: Secondary | ICD-10-CM | POA: Diagnosis present

## 2015-01-14 DIAGNOSIS — K219 Gastro-esophageal reflux disease without esophagitis: Secondary | ICD-10-CM | POA: Diagnosis present

## 2015-01-14 DIAGNOSIS — Z79899 Other long term (current) drug therapy: Secondary | ICD-10-CM

## 2015-01-14 DIAGNOSIS — D72829 Elevated white blood cell count, unspecified: Secondary | ICD-10-CM | POA: Diagnosis not present

## 2015-01-14 DIAGNOSIS — R0602 Shortness of breath: Secondary | ICD-10-CM | POA: Diagnosis not present

## 2015-01-14 DIAGNOSIS — R109 Unspecified abdominal pain: Secondary | ICD-10-CM

## 2015-01-14 DIAGNOSIS — K5900 Constipation, unspecified: Secondary | ICD-10-CM | POA: Diagnosis present

## 2015-01-14 HISTORY — DX: Anemia, unspecified: D64.9

## 2015-01-14 LAB — PROTIME-INR
INR: 1.14 (ref 0.00–1.49)
PROTHROMBIN TIME: 14.8 s (ref 11.6–15.2)

## 2015-01-14 LAB — CBC WITH DIFFERENTIAL/PLATELET
BASOS ABS: 0.1 10*3/uL (ref 0.0–0.1)
BASOS ABS: 0.1 10*3/uL (ref 0.0–0.1)
BASOS PCT: 1 % (ref 0–1)
Basophils Relative: 1 % (ref 0–1)
EOS ABS: 0.1 10*3/uL (ref 0.0–0.7)
EOS ABS: 0.1 10*3/uL (ref 0.0–0.7)
Eosinophils Relative: 1 % (ref 0–5)
Eosinophils Relative: 1 % (ref 0–5)
HCT: 30 % — ABNORMAL LOW (ref 36.0–46.0)
HCT: 28.2 % — ABNORMAL LOW (ref 36.0–46.0)
Hemoglobin: 10 g/dL — ABNORMAL LOW (ref 12.0–15.0)
Hemoglobin: 9.2 g/dL — ABNORMAL LOW (ref 12.0–15.0)
LYMPHS ABS: 2.5 10*3/uL (ref 0.7–4.0)
Lymphocytes Relative: 17 % (ref 12–46)
Lymphocytes Relative: 9 % — ABNORMAL LOW (ref 12–46)
Lymphs Abs: 1.4 10*3/uL (ref 0.7–4.0)
MCH: 29.7 pg (ref 26.0–34.0)
MCH: 29.2 pg (ref 26.0–34.0)
MCHC: 33.3 g/dL (ref 30.0–36.0)
MCHC: 32.6 g/dL (ref 30.0–36.0)
MCV: 89 fL (ref 78.0–100.0)
MCV: 89.5 fL (ref 78.0–100.0)
MONO ABS: 0.9 10*3/uL (ref 0.1–1.0)
MONO ABS: 1 10*3/uL (ref 0.1–1.0)
MONOS PCT: 6 % (ref 3–12)
MONOS PCT: 7 % (ref 3–12)
NEUTROS ABS: 11.2 10*3/uL — AB (ref 1.7–7.7)
NEUTROS ABS: 12.5 10*3/uL — AB (ref 1.7–7.7)
Neutrophils Relative %: 75 % (ref 43–77)
Neutrophils Relative %: 83 % — ABNORMAL HIGH (ref 43–77)
PLATELETS: 1041 10*3/uL — AB (ref 150–400)
PLATELETS: 1043 10*3/uL — AB (ref 150–400)
RBC: 3.37 MIL/uL — AB (ref 3.87–5.11)
RBC: 3.15 MIL/uL — ABNORMAL LOW (ref 3.87–5.11)
RDW: 17.7 % — AB (ref 11.5–15.5)
RDW: 17.6 % — AB (ref 11.5–15.5)
WBC: 14.8 10*3/uL — AB (ref 4.0–10.5)
WBC: 15.1 10*3/uL — ABNORMAL HIGH (ref 4.0–10.5)

## 2015-01-14 LAB — URINALYSIS, ROUTINE W REFLEX MICROSCOPIC
BILIRUBIN URINE: NEGATIVE
GLUCOSE, UA: NEGATIVE mg/dL
Ketones, ur: NEGATIVE mg/dL
Nitrite: NEGATIVE
PH: 6 (ref 5.0–8.0)
Protein, ur: NEGATIVE mg/dL
SPECIFIC GRAVITY, URINE: 1.016 (ref 1.005–1.030)
Urobilinogen, UA: 0.2 mg/dL (ref 0.0–1.0)

## 2015-01-14 LAB — D-DIMER, QUANTITATIVE (NOT AT ARMC): D DIMER QUANT: 2.08 ug{FEU}/mL — AB (ref 0.00–0.48)

## 2015-01-14 LAB — COMPREHENSIVE METABOLIC PANEL
ALT: 31 U/L (ref 14–54)
AST: 28 U/L (ref 15–41)
Albumin: 3.6 g/dL (ref 3.5–5.0)
Alkaline Phosphatase: 124 U/L (ref 38–126)
Anion gap: 7 (ref 5–15)
BILIRUBIN TOTAL: 1.8 mg/dL — AB (ref 0.3–1.2)
BUN: 7 mg/dL (ref 6–20)
CHLORIDE: 107 mmol/L (ref 101–111)
CO2: 24 mmol/L (ref 22–32)
CREATININE: 0.42 mg/dL — AB (ref 0.44–1.00)
Calcium: 9.2 mg/dL (ref 8.9–10.3)
Glucose, Bld: 110 mg/dL — ABNORMAL HIGH (ref 65–99)
POTASSIUM: 3.8 mmol/L (ref 3.5–5.1)
Sodium: 138 mmol/L (ref 135–145)
TOTAL PROTEIN: 7.7 g/dL (ref 6.5–8.1)

## 2015-01-14 LAB — APTT: APTT: 31 s (ref 24–37)

## 2015-01-14 LAB — PHOSPHORUS: PHOSPHORUS: 4.6 mg/dL (ref 2.5–4.6)

## 2015-01-14 LAB — MAGNESIUM: MAGNESIUM: 2 mg/dL (ref 1.7–2.4)

## 2015-01-14 LAB — BASIC METABOLIC PANEL
Anion gap: 8 (ref 5–15)
BUN: 10 mg/dL (ref 6–20)
CALCIUM: 9.3 mg/dL (ref 8.9–10.3)
CO2: 23 mmol/L (ref 22–32)
CREATININE: 0.46 mg/dL (ref 0.44–1.00)
Chloride: 107 mmol/L (ref 101–111)
GFR calc Af Amer: >60 mL/min (ref >60)
GLUCOSE: 120 mg/dL — AB (ref 65–99)
POTASSIUM: 3.7 mmol/L (ref 3.5–5.1)
SODIUM: 138 mmol/L (ref 135–145)

## 2015-01-14 LAB — URINE MICROSCOPIC-ADD ON

## 2015-01-14 LAB — RETICULOCYTES
RBC.: 3.13 MIL/uL — ABNORMAL LOW (ref 3.87–5.11)
RETIC CT PCT: 9.9 % — AB (ref 0.4–3.1)
Retic Count, Absolute: 309.9 10*3/uL — ABNORMAL HIGH (ref 19.0–186.0)

## 2015-01-14 LAB — TSH: TSH: 0.741 u[IU]/mL (ref 0.350–4.500)

## 2015-01-14 LAB — STREP PNEUMONIAE URINARY ANTIGEN: STREP PNEUMO URINARY ANTIGEN: NEGATIVE

## 2015-01-14 MED ORDER — PIPERACILLIN-TAZOBACTAM 3.375 G IVPB
3.3750 g | Freq: Three times a day (TID) | INTRAVENOUS | Status: DC
  Administered 2015-01-14 – 2015-01-16 (×6): 3.375 g via INTRAVENOUS
  Filled 2015-01-14 (×7): qty 50

## 2015-01-14 MED ORDER — IOHEXOL 350 MG/ML SOLN
100.0000 mL | Freq: Once | INTRAVENOUS | Status: AC | PRN
Start: 2015-01-14 — End: 2015-01-14
  Administered 2015-01-14: 100 mL via INTRAVENOUS

## 2015-01-14 MED ORDER — PIPERACILLIN-TAZOBACTAM 3.375 G IVPB 30 MIN
3.3750 g | Freq: Three times a day (TID) | INTRAVENOUS | Status: DC
  Filled 2015-01-14: qty 50

## 2015-01-14 MED ORDER — FOLIC ACID 1 MG PO TABS
1.0000 mg | ORAL_TABLET | Freq: Every day | ORAL | Status: DC
  Administered 2015-01-14 – 2015-01-15 (×2): 1 mg via ORAL
  Filled 2015-01-14 (×3): qty 1

## 2015-01-14 MED ORDER — PIPERACILLIN-TAZOBACTAM 3.375 G IVPB 30 MIN
3.3750 g | Freq: Once | INTRAVENOUS | Status: AC
  Administered 2015-01-14: 3.375 g via INTRAVENOUS

## 2015-01-14 MED ORDER — ALBUTEROL SULFATE (2.5 MG/3ML) 0.083% IN NEBU
5.0000 mg | INHALATION_SOLUTION | Freq: Once | RESPIRATORY_TRACT | Status: AC
  Administered 2015-01-14: 5 mg via RESPIRATORY_TRACT
  Filled 2015-01-14: qty 6

## 2015-01-14 MED ORDER — VANCOMYCIN HCL IN DEXTROSE 750-5 MG/150ML-% IV SOLN
750.0000 mg | Freq: Three times a day (TID) | INTRAVENOUS | Status: DC
  Administered 2015-01-14 – 2015-01-15 (×3): 750 mg via INTRAVENOUS
  Filled 2015-01-14 (×4): qty 150

## 2015-01-14 MED ORDER — VANCOMYCIN HCL IN DEXTROSE 1-5 GM/200ML-% IV SOLN
1000.0000 mg | Freq: Once | INTRAVENOUS | Status: AC
  Administered 2015-01-14: 1000 mg via INTRAVENOUS
  Filled 2015-01-14: qty 200

## 2015-01-14 MED ORDER — IBUPROFEN 800 MG PO TABS: 800.0000 mg | ORAL_TABLET | Freq: Three times a day (TID) | ORAL | Status: DC | PRN

## 2015-01-14 MED ORDER — SODIUM CHLORIDE 0.9 % IV SOLN
INTRAVENOUS | Status: DC
  Administered 2015-01-14 – 2015-01-15 (×2): via INTRAVENOUS

## 2015-01-14 MED ORDER — PRENATAL MULTIVITAMIN CH
1.0000 | ORAL_TABLET | Freq: Every day | ORAL | Status: DC
  Administered 2015-01-14 – 2015-01-15 (×2): 1 via ORAL
  Filled 2015-01-14 (×3): qty 1

## 2015-01-14 MED ORDER — ONDANSETRON HCL 4 MG PO TABS: 4.0000 mg | ORAL_TABLET | Freq: Four times a day (QID) | ORAL | Status: DC | PRN

## 2015-01-14 MED ORDER — ONDANSETRON HCL 4 MG/2ML IJ SOLN: 4.0000 mg | Freq: Four times a day (QID) | INTRAMUSCULAR | Status: DC | PRN

## 2015-01-14 MED ORDER — IPRATROPIUM-ALBUTEROL 0.5-2.5 (3) MG/3ML IN SOLN: 3.0000 mL | RESPIRATORY_TRACT | Status: DC | PRN

## 2015-01-14 MED ORDER — SODIUM CHLORIDE 0.9 % IJ SOLN
3.0000 mL | Freq: Two times a day (BID) | INTRAMUSCULAR | Status: DC
  Administered 2015-01-14: 3 mL via INTRAVENOUS

## 2015-01-14 MED ORDER — HYDROMORPHONE HCL 1 MG/ML IJ SOLN
1.0000 mg | INTRAMUSCULAR | Status: DC | PRN
  Administered 2015-01-14 – 2015-01-15 (×3): 1 mg via INTRAVENOUS
  Filled 2015-01-14 (×5): qty 1

## 2015-01-14 MED ORDER — FERROUS SULFATE 325 (65 FE) MG PO TABS
325.0000 mg | ORAL_TABLET | Freq: Every day | ORAL | Status: DC
  Administered 2015-01-14 – 2015-01-16 (×3): 325 mg via ORAL
  Filled 2015-01-14 (×4): qty 1

## 2015-01-14 MED ORDER — HYDROCODONE-ACETAMINOPHEN 5-325 MG PO TABS: 1.0000 | ORAL_TABLET | ORAL | Status: DC | PRN

## 2015-01-14 NOTE — ED Notes (Signed)
Report called to [facility], RN - pt to be transported to [facility].  Sbar reviewed, all questions answered.

## 2015-01-14 NOTE — ED Notes (Signed)
Patient transported to CT 

## 2015-01-14 NOTE — ED Provider Notes (Signed)
CSN: [id number]     Arrival date & time 01/14/15  0221 History   First MD Initiated Contact with Patient 01/14/15 0241     Chief Complaint  Patient presents with  . Shortness of Breath     (Consider location/radiation/quality/duration/timing/severity/associated sxs/prior Treatment) HPI Comments: Right sided chest pain that is sharp and worse with breathing or cough, starting yesterday. She has had progressive SOB. No fever. No nausea or vomiting. She is 2 weeks postpartum from a normal, uncomplicated vaginal delivery. She reports after delivery she had similar pain and SOB on the left chest and evaluation, including what patient and husband describe as a CT scan, was negative. She felt better with nebulizer treatment then. No symptoms for 2 weeks. 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  Patient is a 29 y.o. female presenting with shortness of breath. The history is provided by the patient and the spouse. No language interpreter was used.  Shortness of Breath Severity:  Severe Onset quality:  Gradual Duration:  2 days Timing:  Constant Progression:  Worsening Chronicity:  New Associated symptoms: chest pain   Associated symptoms: no fever and no vomiting     Past Medical History  Diagnosis Date  . GERD (gastroesophageal reflux disease)   . Sickle cell anemia   . Anemia    Past Surgical History  Procedure Laterality Date  . Vagina surgery     No family history on file. Social History  Substance Use Topics  . Smoking status: Never Smoker   . Smokeless tobacco: None  . Alcohol Use: No   OB History    Gravida Para Term Preterm AB TAB SAB Ectopic Multiple Living   3    2  2         Review of Systems  Constitutional: Negative for fever and chills.  HENT: Negative.  Negative for congestion.   Respiratory: Positive for shortness of breath.   Cardiovascular: Positive for chest pain.  Gastrointestinal: Negative.  Negative for nausea and vomiting.   Musculoskeletal: Negative.   Skin: Negative.   Neurological: Negative.       Allergies  Review of patient's allergies indicates no known allergies.  Home Medications   Prior to Admission medications   Medication Sig Start Date End Date Taking? Authorizing Provider  docusate sodium (COLACE) 100 MG capsule Take 100 mg by mouth 2 (two) times daily.   Yes Historical Provider, MD  ferrous sulfate 325 (65 FE) MG tablet Take 1 tablet by mouth daily. 12/29/14  Yes Historical Provider, MD  folic acid (FOLVITE) 1 MG tablet Take 1 mg by mouth daily.   Yes Historical Provider, MD  HYDROcodone-acetaminophen (NORCO/VICODIN) 5-325 MG per tablet Take 1 tablet by mouth every 4 (four) hours as needed. pain   Yes Historical Provider, MD  ibuprofen (ADVIL,MOTRIN) 800 MG tablet Take 800 mg by mouth every 8 (eight) hours as needed. pain   Yes Historical Provider, MD  Prenatal Vit-Fe Fumarate-FA (PRENATAL MULTIVITAMIN) TABS tablet Take 1 tablet by mouth daily at 12 noon.   Yes Historical Provider, MD  azithromycin (ZITHROMAX) 500 MG tablet Take 1 tablet by mouth daily. 01/02/15   Historical Provider, MD  ipratropium-albuterol (DUONEB) 0.5-2.5 (3) MG/3ML SOLN Inhale 3 mLs into the lungs 2 (two) times daily as needed. Worsening shortness of breath/wheezing 01/01/15 01/31/15  Historical Provider, MD   BP 100/56 mmHg  Pulse 99  Temp(Src) 98.4 F (36.9 C) (Oral)  Resp 22  SpO2 97%  LMP 04/28/2014  Breastfeeding? Unknown  Physical Exam  Constitutional: She is oriented to person, place, and time. She appears well-developed and well-nourished.  HENT:  Head: Normocephalic.  Neck: Normal range of motion. Neck supple.  Cardiovascular: Normal rate and regular rhythm.   Pulmonary/Chest: Breath sounds normal. She has no wheezes. She has no rales.  Tachypneic, in moderate respiratory distress with shallow breathing.   Abdominal: Soft. Bowel sounds are normal. There is no tenderness. There is no rebound and no  guarding.  Musculoskeletal: Normal range of motion.  Neurological: She is alert and oriented to person, place, and time.  Skin: Skin is warm and dry. No rash noted.  Psychiatric: She has a normal mood and affect.    ED Course  Procedures (including critical care time) Labs Review Labs Reviewed  D-DIMER, QUANTITATIVE (NOT AT [facility])  BASIC METABOLIC PANEL  CBC WITH DIFFERENTIAL/PLATELET   Results for orders placed or performed during the hospital encounter of 01/14/15  D-dimer, quantitative (not at [facility])  Result Value Ref Range   D-Dimer, Quant 2.08 (H) 0.00 - 0.48 ug/mL-FEU  Basic metabolic panel  Result Value Ref Range   Sodium 138 135 - 145 mmol/L   Potassium 3.7 3.5 - 5.1 mmol/L   Chloride 107 101 - 111 mmol/L   CO2 23 22 - 32 mmol/L   Glucose, Bld 120 (H) 65 - 99 mg/dL   BUN 10 6 - 20 mg/dL   Creatinine, Ser 0.46 0.44 - 1.00 mg/dL   Calcium 9.3 8.9 - 10.3 mg/dL   GFR calc non Af Amer >60 >60 mL/min   GFR calc Af Amer >60 >60 mL/min   Anion gap 8 5 - 15  CBC with Differential  Result Value Ref Range   WBC 14.8 (H) 4.0 - 10.5 K/uL   RBC 3.37 (L) 3.87 - 5.11 MIL/uL   Hemoglobin 10.0 (L) 12.0 - 15.0 g/dL   HCT 30.0 (L) 36.0 - 46.0 %   MCV 89.0 78.0 - 100.0 fL   MCH 29.7 26.0 - 34.0 pg   MCHC 33.3 30.0 - 36.0 g/dL   RDW 17.7 (H) 11.5 - 15.5 %   Platelets 1041 (HH) 150 - 400 K/uL   Neutrophils Relative % 75 43 - 77 %   Lymphocytes Relative 17 12 - 46 %   Monocytes Relative 6 3 - 12 %   Eosinophils Relative 1 0 - 5 %   Basophils Relative 1 0 - 1 %   Neutro Abs 11.2 (H) 1.7 - 7.7 K/uL   Lymphs Abs 2.5 0.7 - 4.0 K/uL   Monocytes Absolute 0.9 0.1 - 1.0 K/uL   Eosinophils Absolute 0.1 0.0 - 0.7 K/uL   Basophils Absolute 0.1 0.0 - 0.1 K/uL   RBC Morphology RARE NRBCs    Smear Review LARGE PLATELETS PRESENT    Imaging Review Dg Chest Port 1 View  01/14/2015   CLINICAL DATA:  Initial evaluation for acute chest pain, shortness of breath.  EXAM: PORTABLE CHEST - 1 VIEW   COMPARISON:  None available.  FINDINGS: Cardiac and mediastinal silhouettes are within normal limits. Tracheal air column midline and patent.  Lungs are mildly hypoinflated. There is minimal patchy left basilar opacity, which may reflect atelectasis or infiltrate. No other focal airspace disease. No pulmonary edema or pleural effusion. No pneumothorax.  No acute osseus abnormality.  IMPRESSION: Mild patchy left basilar opacity, likely atelectasis, although possible early/ developing infiltrate could be considered in the correct clinical setting.   Electronically Signed   By: [name]  M.D.   On: 01/14/2015 03:19   I have personally reviewed and evaluated these images and lab results as part of my medical decision-making.   EKG Interpretation None      MDM   Final diagnoses:  None    1. Pneumonia  4:00: D-dimer elevated. Proceed to CT angio PE study. Re-eval. - she is still breathing rapidly. Reports she feels better with nebulizer treatment but appearance of breathing is unchanged.   CT showing "patchy airspace consolidation, pna vs noninfectious atx". No PE. Re-evaluation shows patient continues to be tachypneic, uncomfortable but non-toxic. She has a history of Sickle Cell Disease, an abnormal chest CT finding, leukocytosis and persistent tachycardia. She  Will discuss admission with [facility].   Dr. Posey Pronto will see the patient and evaluate for admission.  [name], PA-C 01/15/15 2025  Julianne Rice, MD 01/17/15 [phone]

## 2015-01-14 NOTE — Progress Notes (Signed)
SICKLE CELL SERVICE PROGRESS NOTE  Joy Ferguson HDQ:[id number] DOB: 1986/04/05 DOA: 01/14/2015 PCP: No PCP Per Patient  Assessment/Plan: Active Problems:   HCAP (healthcare-associated pneumonia)  1. HCAP:  Pt presents with multifocal opacities which could represent Pneumonia vs atelectasis. However patient was recently treated for CAP and clinically has a mild increase in work of breathing. She is currently oxygenating well so will not precede with partial exchange transfusion, but will give antibiotics time to work as long as there is no deterioration.  2. Sickle Cell Disease with crisis:Pt was diagnosed wit Sickle Cell disease in [facility] and was seen by Dr. Sonny Dandy in Hematology. I have spoken with Dr Sonny Dandy who recalls that her records show Hb SS. However she did not obtain the electrophoresis as ordered. I will obtain Hb Electrophoresis. She has mild pain at this time and is opiate naive. Will treat with Tylenol and/Toradol and escalate care as needed if pain not responsive. Order heating pad for adjunctive care and incentive spirometer.  3. Leukocytosis: Pt has a mild leukocytosis which may be resultant from pneumonia versus crisis.  4. Anemia: baseline Hb unknown. Will continue to monitor. 5. Tachycardia: Pt has a mild tachycardia with HR in sinus tachycardia on telemetry. Will continue hydration and monitor. If HR escalates in setting of respiratory deterioration,  will consider partial exchange transfusion.  Code Status: Full Code Family Communication: N/A Disposition Plan: Not yet ready for discharge  [place].  Pager [phone]. If 7PM-7AM, please contact night-coverage.  01/14/2015, 12:38 PM  LOS: 0 days   Pertinent History: Pt admitted today by Dr. Charlies Silvers. She asked me to assume care for the patient given her Sickle Cell Disease. I have reviewed her records from the ED and her records from [place] and [facility]. Pt recently delivered her 1st baby by vaginal delivery on  12/28/2014. During the course of her hospitalization she developed lung opacities and was transferred to [facility] for fear of Acute Chest. It was felt that she did not have acute chest Syndrome and she was treated with Azithromycin for Community Acquired Pneumonia. At the time of her discharge she had a WBC of 16 and Hb of 8.9. She also had a platelet count of 684 K. With regard to her Sickle Cell Disease, patient reports that she has had pain in her legs intermittently which is triggered by extreme hot or cold weather. However seh has never taken anything for the pain.   Consultants:  Verbal Consultation with Dr. Sonny Dandy  Procedures:  None  Antibiotics:  Zosyn 9/1 >>  Vancomycin 9/1 >>  HPI/Subjective: Pt reports pain in the right infra costal region and intermittently in her left knee region. She is unable to rate the level of pain but reports that it is a sharp pain.   Objective: Filed Vitals:   01/14/15 0926 01/14/15 0933 01/14/15 0951 01/14/15 1002  BP: 108/60  106/62   Pulse: 99  93   Temp:  98 F (36.7 C) 97.5 F (36.4 C)   TempSrc:  Oral Oral   Resp: 18  18   Height:    5\' 4"  (1.626 m)  Weight:    114 lb 10.2 oz (52 kg)  SpO2: 98%  98%    Weight change:  No intake or output data in the 24 hours ending 01/14/15 1238  General: Alert, awake, oriented x3, with mild increased work of breathing but able to speak comfortably in full sentences.  HEENT: [place]/AT PEERL, EOMI. anicteric Neck:  Trachea midline,  no masses, no thyromegal,y no JVD, no carotid bruit OROPHARYNX:  Moist, No exudate/ erythema/lesions.  Heart: Mild tachycardia with regular rhythm, without murmurs, rubs, gallops, PMI non-displaced, no heaves or thrills on palpation.  Lungs: Clear to auscultation, no wheezing or rhonchi noted. No increased vocal fremitus resonant to percussion  Abdomen: Soft, nontender, nondistended, positive bowel sounds, no masses no hepatosplenomegaly noted..  Neuro: No focal  neurological deficits noted cranial nerves II through XII grossly intact. DTRs 2+ bilaterally upper and lower extremities. Strengthat functional baseline in bilateral upper and lower extremities. Musculoskeletal: No warm swelling or erythema around joints, no spinal tenderness noted. Psychiatric: Patient alert and oriented x3, good insight and cognition, good recent to remote recall.    Data Reviewed: Basic Metabolic Panel:  Recent Labs Lab 01/14/15 0318 01/14/15 1050  NA 138 138  K 3.7 3.8  CL 107 107  CO2 23 24  GLUCOSE 120* 110*  BUN 10 7  CREATININE 0.46 0.42*  CALCIUM 9.3 9.2  MG  --  2.0  PHOS  --  4.6   Liver Function Tests:  Recent Labs Lab 01/14/15 1050  AST 28  ALT 31  ALKPHOS 124  BILITOT 1.8*  PROT 7.7  ALBUMIN 3.6   No results for input(s): LIPASE, AMYLASE in the last 168 hours. No results for input(s): AMMONIA in the last 168 hours. CBC:  Recent Labs Lab 01/14/15 0318 01/14/15 1050  WBC 14.8* 15.1*  NEUTROABS 11.2* 12.5*  HGB 10.0* 9.2*  HCT 30.0* 28.2*  MCV 89.0 89.5  PLT 1041* 1043*   Cardiac Enzymes: No results for input(s): CKTOTAL, CKMB, CKMBINDEX, TROPONINI in the last 168 hours. BNP (last 3 results) No results for input(s): BNP in the last 8760 hours.  ProBNP (last 3 results) No results for input(s): PROBNP in the last 8760 hours.  CBG: No results for input(s): GLUCAP in the last 168 hours.  No results found for this or any previous visit (from the past 240 hour(s)).   Studies: Ct Angio Chest Pe W/cm &/or Wo Cm  01/14/2015   CLINICAL DATA:  Postpartum pneumonia. Now with new right chest pain.  EXAM: CT ANGIOGRAPHY CHEST WITH CONTRAST  TECHNIQUE: Multidetector CT imaging of the chest was performed using the standard protocol during bolus administration of intravenous contrast. Multiplanar CT image reconstructions and MIPs were obtained to evaluate the vascular anatomy.  CONTRAST:  178mL OMNIPAQUE IOHEXOL 350 MG/ML SOLN  COMPARISON:   None.  FINDINGS: Cardiovascular: There is good opacification of the pulmonary arteries. There is no pulmonary embolism. The thoracic aorta is normal in caliber and intact.  Lungs: There is patchy airspace consolidation in the posterior lower lobes bilaterally and in the posterior lingula. This could represent infectious infiltrate or noninfectious atelectasis.  Central airways: Patent  Effusions: None  Lymphadenopathy: None  Esophagus: Unremarkable  Upper abdomen: No significant abnormality  Musculoskeletal: No significant abnormality  Review of the MIP images confirms the above findings.  IMPRESSION: Negative for pulmonary embolism. There is multifocal patchy airspace consolidation which could represent pneumonia or noninfectious atelectasis.   Electronically Signed   By: [name] M.D.   On: 01/14/2015 06:07   Dg Chest Port 1 View  01/14/2015   CLINICAL DATA:  Initial evaluation for acute chest pain, shortness of breath.  EXAM: PORTABLE CHEST - 1 VIEW  COMPARISON:  None available.  FINDINGS: Cardiac and mediastinal silhouettes are within normal limits. Tracheal air column midline and patent.  Lungs are mildly hypoinflated. There  is minimal patchy left basilar opacity, which may reflect atelectasis or infiltrate. No other focal airspace disease. No pulmonary edema or pleural effusion. No pneumothorax.  No acute osseus abnormality.  IMPRESSION: Mild patchy left basilar opacity, likely atelectasis, although possible early/ developing infiltrate could be considered in the correct clinical setting.   Electronically Signed   By: [name] M.D.   On: 01/14/2015 03:19    Scheduled Meds: . ferrous sulfate  325 mg Oral Q breakfast  . folic acid  1 mg Oral Daily  . piperacillin-tazobactam (ZOSYN)  IV  3.375 g Intravenous Q8H  . prenatal multivitamin  1 tablet Oral Q1200  . sodium chloride  3 mL Intravenous Q12H  . vancomycin  1,000 mg Intravenous Once  . vancomycin  750 mg Intravenous Q8H    Continuous Infusions: . sodium chloride 75 mL/hr at 01/14/15 0900

## 2015-01-14 NOTE — Progress Notes (Signed)
ANTIBIOTIC CONSULT NOTE - INITIAL  Pharmacy Consult for Vancomycin/ renal adjusment of abx ( Zosyn) Indication: HCAP  No Known Allergies  Patient Measurements: 60.6 kg ( also confirmed with ER RN)   Adjusted Body Weight:   Vital Signs: Temp: 98.6 F (37 C) (09/01 0513) Temp Source: Oral (09/01 0513) BP: 100/59 mmHg (09/01 0707) Pulse Rate: 90 (09/01 0707) Intake/Output from previous day:   Intake/Output from this shift:    Labs:  Recent Labs  01/14/15 0318  WBC 14.8*  HGB 10.0*  PLT 1041*  CREATININE 0.46   CrCl is > 100 ml/min.    CrCl cannot be calculated (Unknown ideal weight.). No results for input(s): VANCOTROUGH, VANCOPEAK, VANCORANDOM, GENTTROUGH, GENTPEAK, GENTRANDOM, TOBRATROUGH, TOBRAPEAK, TOBRARND, AMIKACINPEAK, AMIKACINTROU, AMIKACIN in the last 72 hours.     Microbiology: Recent Results (from the past 720 hour(s))  Group B strep by PCR     Status: None   Collection Time: 12/26/14  5:30 AM  Result Value Ref Range Status   Group B strep by PCR NEGATIVE NEGATIVE Final    Medical History: Past Medical History  Diagnosis Date  . GERD (gastroesophageal reflux disease)   . Sickle cell anemia   . Anemia     Medications:   (Not in a hospital admission) Assessment: Pt is a 29 yo female diagnosed with HCAP, being treated with vancomycin and Zosyn. Pt has sx of left lung pain/cough/SOB that stretch back 2 weeks when pt gave recent birth. Recent diagnosis of Hgb SS disease.  Pt was on azithromcyin 500 mg PO daily as outpt, completed course.   Goal of Therapy:  Vancomycin trough level 15-20 mcg/ml  Eradication of disease.   Plan:  Follow up culture results  Will continue Zosyn 3.375 gr IV q8h EI. Will order vancomycin 1000 mg IV x1, then start vancomycin 750 mg IV q8h.   Royetta Asal PharmD, BCPS 01/14/15 08:56

## 2015-01-14 NOTE — ED Notes (Signed)
Pt states that she was diagnosed with Pneumonia shortly after the baby was born; pt states that she was having left sided lung pain and cough; pt states that she began to have rt sided rib, back pain yesterday around 5pm; pt c/o mild cough, pt states that it hurts to cough and shortness of breath; pt states that the shortness of breath is worse with exertion

## 2015-01-14 NOTE — ED Notes (Signed)
Zosyn L2  and Vancomycin L1 - Low risk for lactating mothers.  Ok to continue breastfeeding

## 2015-01-14 NOTE — H&P (Addendum)
[facility] History and Physical  Joy Ferguson CVE:[id number] DOB: 1986/02/25 DOA: 01/14/2015  Referring physician: ER PA: [name] PCP: No PCP Per Patient - will provide info on [facility]  Chief Complaint: right side rib cage pain  HPI:  29 year old female with past medical history significant for sickle cell disease, has recently gave birth, developed pneumonia subsequently and has required hospitalization for 3 days (patient and her husband at the bedside not really sure of the exact date of hospitalization but he was recently). Patient presents to [facility] this time with worsening shortness of breath and pain along the right side of the rib cage. Pain is present at rest and with movement. Patient describes constant pain, sharp and at worst about 6-7 out of 10 in intensity, nonradiating. Patient reports pain was little bit better with analgesia given in ED. No reports of fevers or chills. No reports of chest pain. No nausea or vomiting. No abdominal pain. No reports of blood in the stool or urine. In ED, patient was hemodynamically stable. She was afebrile. Blood work was significant for leukocytosis of 14.8, hemoglobin 10 and platelets of 1041. CT angiogram of the chest was negative for pulmonary embolism but he did show multifocal patchy airspace consolidation that could represent pneumonia. Because of her recent hospitalization for pneumonia patient was started on vancomycin and Zosyn for treatment of healthcare associated pneumonia.  Assessment & Plan    Active Problems: HCAP (healthcare-associated pneumonia) / leukocytosis - Patient will be treated empirically with vancomycin and Zosyn until we receive blood culture results, respiratory culture results. - CT angiogram on the admission did not show pulmonary embolism but it did show findings concerning for pneumonia, multifocal patchy airspace consolidation is noted. - Chest x-ray showed mild patchy left basilar opacity -  Follow up strep pneumonia, Legionella, HIV results. - Monitor on telemetry. - Respiratory status stable  Sickle cell disease / Sickle cell anemia  - Not currently on any treatment - Continue pain management efforts  Thrombocytosis - Chronic, patient's platelets run high in 400-500 range. - On this admission her platelets are in 1000 range. - I informed sickle cell team of patient's admission. We'll defer to sickle cell team to determine if patient requires phlebotomy, aspirin   DVT prophylaxis:  - SCDs bilaterally  Radiological Exams on Admission: Ct Angio Chest Pe W/cm &/or Wo Cm  01/14/2015   CLINICAL DATA:  Postpartum pneumonia. Now with new right chest pain.  EXAM: CT ANGIOGRAPHY CHEST WITH CONTRAST  TECHNIQUE: Multidetector CT imaging of the chest was performed using the standard protocol during bolus administration of intravenous contrast. Multiplanar CT image reconstructions and MIPs were obtained to evaluate the vascular anatomy.  CONTRAST:  166mL OMNIPAQUE IOHEXOL 350 MG/ML SOLN  COMPARISON:  None.  FINDINGS: Cardiovascular: There is good opacification of the pulmonary arteries. There is no pulmonary embolism. The thoracic aorta is normal in caliber and intact.  Lungs: There is patchy airspace consolidation in the posterior lower lobes bilaterally and in the posterior lingula. This could represent infectious infiltrate or noninfectious atelectasis.  Central airways: Patent  Effusions: None  Lymphadenopathy: None  Esophagus: Unremarkable  Upper abdomen: No significant abnormality  Musculoskeletal: No significant abnormality  Review of the MIP images confirms the above findings.  IMPRESSION: Negative for pulmonary embolism. There is multifocal patchy airspace consolidation which could represent pneumonia or noninfectious atelectasis.   Electronically Signed   By: [name] M.D.   On: 01/14/2015 06:07   Dg Chest Port 1  View  01/14/2015   CLINICAL DATA:  Initial evaluation for acute  chest pain, shortness of breath.  EXAM: PORTABLE CHEST - 1 VIEW  COMPARISON:  None available.  FINDINGS: Cardiac and mediastinal silhouettes are within normal limits. Tracheal air column midline and patent.  Lungs are mildly hypoinflated. There is minimal patchy left basilar opacity, which may reflect atelectasis or infiltrate. No other focal airspace disease. No pulmonary edema or pleural effusion. No pneumothorax.  No acute osseus abnormality.  IMPRESSION: Mild patchy left basilar opacity, likely atelectasis, although possible early/ developing infiltrate could be considered in the correct clinical setting.   Electronically Signed   By: [name] M.D.   On: 01/14/2015 03:19     Code Status: Full Family Communication: Plan of care discussed with the patient and her husband and the bedside Disposition Plan: Admit for further evaluation, telemetry  DEVINE, [name], MD  [facility] Pager [phone]  Time spent in minutes: 75 minutes  Review of Systems:  Constitutional: Negative for fever, chills and malaise/fatigue. Negative for diaphoresis.  HENT: Negative for hearing loss, ear pain, nosebleeds, congestion, sore throat, neck pain, tinnitus and ear discharge.   Eyes: Negative for blurred vision, double vision, photophobia, pain, discharge and redness.  Respiratory: Per history of present illness  Cardiovascular: Negative for chest pain, palpitations, orthopnea, claudication and leg swelling.  Gastrointestinal: Negative for nausea, vomiting and abdominal pain. Negative for heartburn, constipation, blood in stool and melena.  Genitourinary: Negative for dysuria, urgency, frequency, hematuria and flank pain.  Musculoskeletal: Negative for myalgias, back pain, joint pain and falls.  Skin: Negative for itching and rash.  Neurological: Negative for dizziness and weakness. Negative for tingling, tremors, sensory change, speech change, focal weakness, loss of consciousness and headaches.   Endo/Heme/Allergies: Negative for environmental allergies and polydipsia. Does not bruise/bleed easily.  Psychiatric/Behavioral: Negative for suicidal ideas. The patient is not nervous/anxious.      Past Medical History  Diagnosis Date  . GERD (gastroesophageal reflux disease)   . Sickle cell anemia   . Anemia    Past Surgical History  Procedure Laterality Date  . Vagina surgery     Social History:  reports that she has never smoked. She does not have any smokeless tobacco history on file. She reports that she does not drink alcohol or use illicit drugs.  No Known Allergies  Family History: Hypertension in family   Prior to Admission medications   Medication Sig Start Date End Date Taking? Authorizing Provider  docusate sodium (COLACE) 100 MG capsule Take 100 mg by mouth 2 (two) times daily.   Yes Historical Provider, MD  ferrous sulfate 325 (65 FE) MG tablet Take 1 tablet by mouth daily. 12/29/14  Yes Historical Provider, MD  folic acid (FOLVITE) 1 MG tablet Take 1 mg by mouth daily.   Yes Historical Provider, MD  HYDROcodone-acetaminophen (NORCO/VICODIN) 5-325 MG per tablet Take 1 tablet by mouth every 4 (four) hours as needed. pain   Yes Historical Provider, MD  ibuprofen (ADVIL,MOTRIN) 800 MG tablet Take 800 mg by mouth every 8 (eight) hours as needed. pain   Yes Historical Provider, MD  Prenatal Vit-Fe Fumarate-FA (PRENATAL MULTIVITAMIN) TABS tablet Take 1 tablet by mouth daily at 12 noon.   Yes Historical Provider, MD  azithromycin (ZITHROMAX) 500 MG tablet Take 1 tablet by mouth daily. 01/02/15   Historical Provider, MD  ipratropium-albuterol (DUONEB) 0.5-2.5 (3) MG/3ML SOLN Inhale 3 mLs into the lungs 2 (two) times daily as needed. Worsening shortness of  breath/wheezing 01/01/15 01/31/15  Historical Provider, MD   Physical Exam: Filed Vitals:   01/14/15 7116 01/14/15 0933 01/14/15 0951 01/14/15 1002  BP: 108/60  106/62   Pulse: 99  93   Temp:  98 F (36.7 C) 97.5 F (36.4  C)   TempSrc:  Oral Oral   Resp: 18  18   Height:    5\' 4"  (1.626 m)  Weight:    52 kg (114 lb 10.2 oz)  SpO2: 98%  98%     Physical Exam  Constitutional: Appears well-nourished, No distress.  HENT: Normocephalic. No tonsillar erythema or exudates Eyes: Conjunctivae and EOM are normal. PERRLA, no scleral icterus.  Neck: Normal ROM. Neck supple. No JVD. No tracheal deviation. No thyromegaly.  CVS: RRR, S1/S2 +, no murmurs, no gallops, no carotid bruit.  Pulmonary: Effort and breath sounds normal, no stridor, rhonchi, wheezes, rales.  Abdominal: Soft. BS +,  no distension, tenderness, rebound or guarding.  Musculoskeletal: Normal range of motion. No edema and no tenderness.  Lymphadenopathy: No lymphadenopathy noted, cervical, inguinal. Neuro: Alert. Normal reflexes, muscle tone coordination. No focal neurologic deficits. Skin: Skin is warm and dry. No rash noted.  No erythema. No pallor.  Psychiatric: Normal mood and affect. Behavior, judgment, thought content normal.   Labs on Admission:  Basic Metabolic Panel:  Recent Labs Lab 01/14/15 0318 01/14/15 1050  NA 138 138  K 3.7 3.8  CL 107 107  CO2 23 24  GLUCOSE 120* 110*  BUN 10 7  CREATININE 0.46 0.42*  CALCIUM 9.3 9.2  MG  --  2.0  PHOS  --  4.6   Liver Function Tests:  Recent Labs Lab 01/14/15 1050  AST 28  ALT 31  ALKPHOS 124  BILITOT 1.8*  PROT 7.7  ALBUMIN 3.6   No results for input(s): LIPASE, AMYLASE in the last 168 hours. No results for input(s): AMMONIA in the last 168 hours. CBC:  Recent Labs Lab 01/14/15 0318 01/14/15 1050  WBC 14.8* 15.1*  NEUTROABS 11.2* 12.5*  HGB 10.0* 9.2*  HCT 30.0* 28.2*  MCV 89.0 89.5  PLT 1041* 1043*   Cardiac Enzymes: No results for input(s): CKTOTAL, CKMB, CKMBINDEX, TROPONINI in the last 168 hours. BNP: Invalid input(s): POCBNP CBG: No results for input(s): GLUCAP in the last 168 hours.  If 7PM-7AM, please contact night-coverage [email or web address] Password  [email or web address] 01/14/2015, 12:02 PM

## 2015-01-15 ENCOUNTER — Inpatient Hospital Stay (HOSPITAL_COMMUNITY): Payer: Medicaid Other

## 2015-01-15 DIAGNOSIS — R000 Tachycardia, unspecified: Secondary | ICD-10-CM

## 2015-01-15 DIAGNOSIS — R1011 Right upper quadrant pain: Secondary | ICD-10-CM

## 2015-01-15 DIAGNOSIS — J189 Pneumonia, unspecified organism: Principal | ICD-10-CM

## 2015-01-15 DIAGNOSIS — D5700 Hb-SS disease with crisis, unspecified: Secondary | ICD-10-CM

## 2015-01-15 DIAGNOSIS — D72829 Elevated white blood cell count, unspecified: Secondary | ICD-10-CM

## 2015-01-15 LAB — CBC WITH DIFFERENTIAL/PLATELET
Basophils Absolute: 0.1 10*3/uL (ref 0.0–0.1)
Basophils Relative: 1 % (ref 0–1)
EOS ABS: 0.2 10*3/uL (ref 0.0–0.7)
EOS PCT: 1 % (ref 0–5)
HCT: 26.7 % — ABNORMAL LOW (ref 36.0–46.0)
Hemoglobin: 8.7 g/dL — ABNORMAL LOW (ref 12.0–15.0)
LYMPHS ABS: 1.8 10*3/uL (ref 0.7–4.0)
Lymphocytes Relative: 10 % — ABNORMAL LOW (ref 12–46)
MCH: 29.3 pg (ref 26.0–34.0)
MCHC: 32.6 g/dL (ref 30.0–36.0)
MCV: 89.9 fL (ref 78.0–100.0)
MONO ABS: 1.3 10*3/uL — AB (ref 0.1–1.0)
MONOS PCT: 7 % (ref 3–12)
Neutro Abs: 14.8 10*3/uL — ABNORMAL HIGH (ref 1.7–7.7)
Neutrophils Relative %: 82 % — ABNORMAL HIGH (ref 43–77)
PLATELETS: 937 10*3/uL — AB (ref 150–400)
RBC: 2.97 MIL/uL — ABNORMAL LOW (ref 3.87–5.11)
RDW: 17 % — AB (ref 11.5–15.5)
WBC: 18.1 10*3/uL — ABNORMAL HIGH (ref 4.0–10.5)

## 2015-01-15 LAB — LACTATE DEHYDROGENASE: LDH: 144 U/L (ref 98–192)

## 2015-01-15 LAB — URINE CULTURE: Culture: NO GROWTH

## 2015-01-15 LAB — HIV ANTIBODY (ROUTINE TESTING W REFLEX): HIV SCREEN 4TH GENERATION: NONREACTIVE

## 2015-01-15 LAB — VANCOMYCIN, TROUGH: Vancomycin Tr: 8 ug/mL — ABNORMAL LOW (ref 10.0–20.0)

## 2015-01-15 LAB — GLUCOSE, CAPILLARY: GLUCOSE-CAPILLARY: 120 mg/dL — AB (ref 65–99)

## 2015-01-15 MED ORDER — VANCOMYCIN HCL IN DEXTROSE 1-5 GM/200ML-% IV SOLN
1000.0000 mg | Freq: Once | INTRAVENOUS | Status: AC
  Administered 2015-01-15: 1000 mg via INTRAVENOUS
  Filled 2015-01-15: qty 200

## 2015-01-15 MED ORDER — MORPHINE SULFATE (PF) 2 MG/ML IV SOLN: 2.0000 mg | INTRAVENOUS | Status: DC | PRN

## 2015-01-15 MED ORDER — IOHEXOL 300 MG/ML  SOLN
100.0000 mL | Freq: Once | INTRAMUSCULAR | Status: AC | PRN
  Administered 2015-01-15: 100 mL via INTRAVENOUS

## 2015-01-15 MED ORDER — SODIUM CHLORIDE 0.9 % IV SOLN
INTRAVENOUS | Status: DC
  Administered 2015-01-15 (×2): via INTRAVENOUS

## 2015-01-15 MED ORDER — HYDROCODONE-ACETAMINOPHEN 5-325 MG PO TABS
1.0000 | ORAL_TABLET | ORAL | Status: DC
  Administered 2015-01-15 (×3): 1 via ORAL
  Filled 2015-01-15 (×4): qty 1

## 2015-01-15 MED ORDER — SENNOSIDES-DOCUSATE SODIUM 8.6-50 MG PO TABS
1.0000 | ORAL_TABLET | Freq: Two times a day (BID) | ORAL | Status: DC
  Administered 2015-01-15: 1 via ORAL
  Filled 2015-01-15 (×3): qty 1

## 2015-01-15 MED ORDER — VANCOMYCIN HCL IN DEXTROSE 1-5 GM/200ML-% IV SOLN
1000.0000 mg | Freq: Three times a day (TID) | INTRAVENOUS | Status: DC
  Administered 2015-01-16: 1000 mg via INTRAVENOUS
  Filled 2015-01-15 (×3): qty 200

## 2015-01-15 MED ORDER — POLYETHYLENE GLYCOL 3350 17 G PO PACK
17.0000 g | PACK | Freq: Every day | ORAL | Status: DC
  Administered 2015-01-15: 17 g via ORAL
  Filled 2015-01-15 (×2): qty 1

## 2015-01-15 MED ORDER — KETOROLAC TROMETHAMINE 30 MG/ML IJ SOLN
30.0000 mg | Freq: Four times a day (QID) | INTRAMUSCULAR | Status: DC
  Administered 2015-01-15 – 2015-01-16 (×2): 30 mg via INTRAVENOUS
  Filled 2015-01-15 (×6): qty 1

## 2015-01-15 MED ORDER — SODIUM CHLORIDE 0.9 % IV SOLN: INTRAVENOUS | Status: DC

## 2015-01-15 NOTE — Progress Notes (Addendum)
SICKLE CELL SERVICE PROGRESS NOTE  Joy Ferguson HCW:[id number] DOB: January 31, 1986 DOA: 01/14/2015 PCP: No PCP Per Patient  Assessment/Plan: Active Problems:   HCAP (healthcare-associated pneumonia)  1. HCAP: Pt has findings consistent with pneumonia and is on Vancomycin and Zosyn Day#2.  I will continue Vancomycin and Zosyn until clinically improved. Unfortunately there were no Blood or sputum cultures taken before antibiotics started to guide therapy. 2. Hb SS with crisis: Pt continues to have pain in the RUQ. She has not had a crisis before thus has no frame of reference for pain. She continue to have pain at 8/10 but has been refusing any analgesics. I have spoken with patient and her husband regarding pain management. I will schedule oral analgesics and continue IV Morphine on an as needed basis. I will also discontinue Ibuprofen and start IV Toradol. 3. RUQ Pain: Etiology is unclear. Will obtain CT of abdomen and pelvis with contrast to evaluate. Doubt obstructive  Pr cholestatic process as there is no elevation in either LFT's or bilirubin and she has no nausea or emesis. 4. Constipation: Will schedule Senn-S and Miralax. If no results try Magnesium Citrate 5. Leukocytosis: Likely combination of infectious and inflammatory processes. 6. Sinus tachycardia: Secondary to pain as HR increases with increase of pain and decreases when pain treated. Will discontinue Telemetry.  Code Status: Full Code Family Communication: N/A Disposition Plan: Not yet ready for discharge  [place].  Pager [phone]. If 7PM-7AM, please contact night-coverage.  01/15/2015, 2:46 PM  LOS: 1 day   Consultants:  None  Procedures:  None  Antibiotics:  Vancomycin 9/1 >>  Zosyn 9/1 >>  HPI/Subjective: She has no increased work of breathing to day but has significant pain in the area of the right upper quadrant and lower chest region both in anterior and posterior regions which she rates as 8/10. She has  not ahd a BM for 2 days.  Objective: Filed Vitals:   01/15/15 0600 01/15/15 0823 01/15/15 1330 01/15/15 1436  BP: 106/71 103/66 102/65   Pulse: 106 98 115   Temp: 98.7 F (37.1 C) 98.4 F (36.9 C) 98.2 F (36.8 C)   TempSrc: Oral Oral Oral   Resp: 22 18 44 28  Height:      Weight:      SpO2: 98% 98% 100%    Weight change:   Intake/Output Summary (Last 24 hours) at 01/15/15 1446 Last data filed at 01/15/15 0900  Gross per 24 hour  Intake   2130 ml  Output    400 ml  Net   1730 ml    General: Alert, awake, oriented x3, in moderate distress due to pain in RUQ.  HEENT: [place]/AT PEERL, EOMI, anicteric Neck: Trachea midline,  no masses, no thyromegal,y no JVD, no carotid bruit OROPHARYNX:  Moist, No exudate/ erythema/lesions.  Heart: Regular rate and rhythm, without murmurs, rubs, gallops, PMI non-displaced, no heaves or thrills on palpation.  Lungs: Clear to auscultation, no wheezing or rhonchi noted. No increased vocal fremitus resonant to percussion  Abdomen: Soft, nontender, nondistended, positive bowel sounds, no masses no hepatosplenomegaly noted..  Neuro: No focal neurological deficits noted cranial nerves II through XII grossly intact.Strength at functional baseline in bilateral upper and lower extremities. Musculoskeletal: No warm swelling or erythema around joints, no spinal tenderness noted. Psychiatric: Patient alert and oriented x3, good insight and cognition, good recent to remote recall. .   Data Reviewed: Basic Metabolic Panel:  Recent Labs Lab 01/14/15 0318 01/14/15 1050  NA 138  138  K 3.7 3.8  CL 107 107  CO2 23 24  GLUCOSE 120* 110*  BUN 10 7  CREATININE 0.46 0.42*  CALCIUM 9.3 9.2  MG  --  2.0  PHOS  --  4.6   Liver Function Tests:  Recent Labs Lab 01/14/15 1050  AST 28  ALT 31  ALKPHOS 124  BILITOT 1.8*  PROT 7.7  ALBUMIN 3.6   No results for input(s): LIPASE, AMYLASE in the last 168 hours. No results for input(s): AMMONIA in the last  168 hours. CBC:  Recent Labs Lab 01/14/15 0318 01/14/15 1050 01/15/15 0610  WBC 14.8* 15.1* 18.1*  NEUTROABS 11.2* 12.5* 14.8*  HGB 10.0* 9.2* 8.7*  HCT 30.0* 28.2* 26.7*  MCV 89.0 89.5 89.9  PLT 1041* 1043* 937*   Cardiac Enzymes: No results for input(s): CKTOTAL, CKMB, CKMBINDEX, TROPONINI in the last 168 hours. BNP (last 3 results) No results for input(s): BNP in the last 8760 hours.  ProBNP (last 3 results) No results for input(s): PROBNP in the last 8760 hours.  CBG:  Recent Labs Lab 01/15/15 0728  GLUCAP 120*    Recent Results (from the past 240 hour(s))  Urine culture     Status: None (Preliminary result)   Collection Time: 01/14/15  4:56 PM  Result Value Ref Range Status   Specimen Description URINE, CLEAN CATCH  Final   Special Requests NONE  Final   Culture   Final    NO GROWTH < 12 HOURS Performed at [facility]    Report Status PENDING  Incomplete     Studies: Ct Angio Chest Pe W/cm &/or Wo Cm  01/14/2015   CLINICAL DATA:  Postpartum pneumonia. Now with new right chest pain.  EXAM: CT ANGIOGRAPHY CHEST WITH CONTRAST  TECHNIQUE: Multidetector CT imaging of the chest was performed using the standard protocol during bolus administration of intravenous contrast. Multiplanar CT image reconstructions and MIPs were obtained to evaluate the vascular anatomy.  CONTRAST:  123mL OMNIPAQUE IOHEXOL 350 MG/ML SOLN  COMPARISON:  None.  FINDINGS: Cardiovascular: There is good opacification of the pulmonary arteries. There is no pulmonary embolism. The thoracic aorta is normal in caliber and intact.  Lungs: There is patchy airspace consolidation in the posterior lower lobes bilaterally and in the posterior lingula. This could represent infectious infiltrate or noninfectious atelectasis.  Central airways: Patent  Effusions: None  Lymphadenopathy: None  Esophagus: Unremarkable  Upper abdomen: No significant abnormality  Musculoskeletal: No significant abnormality  Review  of the MIP images confirms the above findings.  IMPRESSION: Negative for pulmonary embolism. There is multifocal patchy airspace consolidation which could represent pneumonia or noninfectious atelectasis.   Electronically Signed   By: [name] M.D.   On: 01/14/2015 06:07   Dg Chest Port 1 View  01/14/2015   CLINICAL DATA:  Initial evaluation for acute chest pain, shortness of breath.  EXAM: PORTABLE CHEST - 1 VIEW  COMPARISON:  None available.  FINDINGS: Cardiac and mediastinal silhouettes are within normal limits. Tracheal air column midline and patent.  Lungs are mildly hypoinflated. There is minimal patchy left basilar opacity, which may reflect atelectasis or infiltrate. No other focal airspace disease. No pulmonary edema or pleural effusion. No pneumothorax.  No acute osseus abnormality.  IMPRESSION: Mild patchy left basilar opacity, likely atelectasis, although possible early/ developing infiltrate could be considered in the correct clinical setting.   Electronically Signed   By: [name] M.D.   On: 01/14/2015 03:19  Scheduled Meds: . ferrous sulfate  325 mg Oral Q breakfast  . folic acid  1 mg Oral Daily  . HYDROcodone-acetaminophen  1 tablet Oral Q4H  . ketorolac  30 mg Intravenous 4 times per day  . piperacillin-tazobactam (ZOSYN)  IV  3.375 g Intravenous Q8H  . polyethylene glycol  17 g Oral Daily  . prenatal multivitamin  1 tablet Oral Q1200  . senna-docusate  1 tablet Oral BID  . sodium chloride  3 mL Intravenous Q12H  . vancomycin  750 mg Intravenous Q8H   Continuous Infusions: . sodium chloride     Followed by  . [START ON 02/02/2015] sodium chloride      Time spent 60 minutes. 50% of time spent in discussion, and coordination of care.

## 2015-01-15 NOTE — Care Management Note (Signed)
Case Management Note  Patient Details  Name: Joy Ferguson MRN: [id number] Date of Birth: Feb 12, 1986  Subjective/Objective:                 Chest pain with presence of ssc   Action/Plan:Date:  Sept.2, 2016 U.R. performed for needs and level of care. Will continue to follow for Case Management needs.  Velva Harman, RN, BSN, [place]   [phone]   Expected Discharge Date:   (unknown)               Expected Discharge Plan:  Home/Self Care  In-House Referral:  Clinical Social Work  Discharge planning Services  CM Consult  Post Acute Care Choice:  NA Choice offered to:  NA  DME Arranged:    DME Agency:     HH Arranged:    [place] Agency:     Status of Service:  Completed, signed off  Medicare Important Message Given:    Date Medicare IM Given:    Medicare IM give by:    Date Additional Medicare IM Given:    Additional Medicare Important Message give by:     If discussed at [place] of Stay Meetings, dates discussed:    Additional Comments:  Leeroy Cha, RN 01/15/2015, 2:21 PM

## 2015-01-15 NOTE — Progress Notes (Signed)
ANTIBIOTIC CONSULT NOTE - FOLLOW UP  Pharmacy Consult for Vancomycin, Zosyn Indication: HAP  Labs:  Recent Labs  01/14/15 0318 01/14/15 1050 01/15/15 0610  WBC 14.8* 15.1* 18.1*  HGB 10.0* 9.2* 8.7*  PLT 1041* 1043* 937*  CREATININE 0.46 0.42*  --    Estimated Creatinine Clearance: 85.2 mL/min (by C-G formula based on Cr of 0.42).  Recent Labs  01/15/15 1750  VANCOTROUGH 8*   Assessment: 66 yoF presents to ED on 9/1 with worsening SOB and right sided rib cage pain.  She recently gave birth and was diagnosed with pneumonia subsequently.  CT negative for PE, but shows patchy consolidation, possible pneumonia.  She recently completed Azithromycin prescription.  She is breastfeeding.  Pharmacy is consulted to dose Vancomycin and Zosyn.  Today, 01/15/2015:  Tmax: 99  WBC 18.1  SCr 0.42, CrCl ~ 85 ml/min  Vancomycin trough level 8 (on 750mg  q8h)  Goal of Therapy:  Vancomycin trough level 15-20 mcg/ml Appropriate abx dosing, eradication of infection.   Plan:   Continue Zosyn 3.375g IV Q8H infused over 4hrs.   Increase to Vancomycin 1g IV q8h.  Measure Vanc trough at steady state.  Follow up renal fxn, culture results, and clinical course.  Gretta Arab PharmD, BCPS Pager [phone] 01/15/2015 6:31 PM

## 2015-01-16 LAB — CBC WITH DIFFERENTIAL/PLATELET
Basophils Absolute: 0.1 10*3/uL (ref 0.0–0.1)
Basophils Relative: 1 % (ref 0–1)
EOS ABS: 0.4 10*3/uL (ref 0.0–0.7)
EOS PCT: 2 % (ref 0–5)
HCT: 23.9 % — ABNORMAL LOW (ref 36.0–46.0)
Hemoglobin: 7.7 g/dL — ABNORMAL LOW (ref 12.0–15.0)
LYMPHS ABS: 1.9 10*3/uL (ref 0.7–4.0)
Lymphocytes Relative: 11 % — ABNORMAL LOW (ref 12–46)
MCH: 28.9 pg (ref 26.0–34.0)
MCHC: 32.2 g/dL (ref 30.0–36.0)
MCV: 89.8 fL (ref 78.0–100.0)
MONO ABS: 1.6 10*3/uL — AB (ref 0.1–1.0)
Monocytes Relative: 9 % (ref 3–12)
Neutro Abs: 13.4 10*3/uL — ABNORMAL HIGH (ref 1.7–7.7)
Neutrophils Relative %: 77 % (ref 43–77)
PLATELETS: 788 10*3/uL — AB (ref 150–400)
RBC: 2.66 MIL/uL — AB (ref 3.87–5.11)
RDW: 16.7 % — AB (ref 11.5–15.5)
WBC: 17.4 10*3/uL — AB (ref 4.0–10.5)

## 2015-01-16 LAB — BASIC METABOLIC PANEL
Anion gap: 6 (ref 5–15)
BUN: 9 mg/dL (ref 6–20)
CALCIUM: 8.3 mg/dL — AB (ref 8.9–10.3)
CO2: 24 mmol/L (ref 22–32)
CREATININE: 0.43 mg/dL — AB (ref 0.44–1.00)
Chloride: 105 mmol/L (ref 101–111)
GFR calc Af Amer: >60 mL/min (ref >60)
Glucose, Bld: 103 mg/dL — ABNORMAL HIGH (ref 65–99)
POTASSIUM: 3.6 mmol/L (ref 3.5–5.1)
SODIUM: 135 mmol/L (ref 135–145)

## 2015-01-16 LAB — GLUCOSE, CAPILLARY: GLUCOSE-CAPILLARY: 93 mg/dL (ref 65–99)

## 2015-01-16 MED ORDER — CLINDAMYCIN HCL 300 MG PO CAPS
300.0000 mg | ORAL_CAPSULE | Freq: Three times a day (TID) | ORAL | Status: DC
Start: 2015-01-16 — End: 2015-02-09

## 2015-01-16 MED ORDER — LEVOFLOXACIN 750 MG PO TABS: 750.0000 mg | ORAL_TABLET | Freq: Every day | ORAL | Status: DC

## 2015-01-16 MED ORDER — HYDROCODONE-ACETAMINOPHEN 5-325 MG PO TABS: 1.0000 | ORAL_TABLET | ORAL | Status: DC | PRN

## 2015-01-16 NOTE — Progress Notes (Signed)
Joy Ferguson to be D/C'd Home per MD order.  Discussed prescriptions and follow up appointments with the patient. Prescriptions given to patient, medication list explained in detail. Pt verbalized understanding.    Medication List    STOP taking these medications        azithromycin 500 MG tablet  Commonly known as:  ZITHROMAX      TAKE these medications        clindamycin 300 MG capsule  Commonly known as:  CLEOCIN  Take 1 capsule (300 mg total) by mouth 3 (three) times daily.     docusate sodium 100 MG capsule  Commonly known as:  COLACE  Take 100 mg by mouth 2 (two) times daily.     ferrous sulfate 325 (65 FE) MG tablet  Take 1 tablet by mouth daily.     folic acid 1 MG tablet  Commonly known as:  FOLVITE  Take 1 mg by mouth daily.     HYDROcodone-acetaminophen 5-325 MG per tablet  Commonly known as:  NORCO/VICODIN  Take 1 tablet by mouth every 4 (four) hours as needed. pain     ibuprofen 800 MG tablet  Commonly known as:  ADVIL,MOTRIN  Take 800 mg by mouth every 8 (eight) hours as needed. pain     ipratropium-albuterol 0.5-2.5 (3) MG/3ML Soln  Commonly known as:  DUONEB  Inhale 3 mLs into the lungs 2 (two) times daily as needed. Worsening shortness of breath/wheezing     levofloxacin 750 MG tablet  Commonly known as:  LEVAQUIN  Take 1 tablet (750 mg total) by mouth daily.     prenatal multivitamin Tabs tablet  Take 1 tablet by mouth daily at 12 noon.        Filed Vitals:   01/16/15 0645  BP: 91/48  Pulse: 89  Temp: 98 F (36.7 C)  Resp: 20    Skin clean, dry and intact without evidence of skin break down, no evidence of skin tears noted. IV catheter discontinued intact. Site without signs and symptoms of complications. Dressing and pressure applied. Pt denies pain at this time. No complaints noted.  An After Visit Summary was printed and given to the patient. Patient escorted via [place], and D/C home via private auto.  [name] 01/16/2015 10:47  AM

## 2015-01-16 NOTE — Discharge Summary (Signed)
Physician Discharge Summary  Patient ID: Joy Ferguson MRN: [id number] DOB/AGE: 09-12-1985 29 y.o.  Admit date: 01/14/2015 Discharge date: 01/16/2015  Admission Diagnoses:  Discharge Diagnoses:  Active Problems:   HCAP (healthcare-associated pneumonia)   Discharged Condition: good  Hospital Course: Patient is a newly diagnosed sickle cell patient admitted with healthcare associated pneumonia after recent childbirth. He had pneumonia a few days after the childbirth went home and now is back again with pain at 6 out of 7 consistent with sickle cell pain. She was also found to have leukocytosis with a white count of 14,000 and temperature of 104.1. CT chest confirmed pneumonia and patient was admitted for treatment of healthcare associated pneumonia. She was treated with IV vancomycin and Zosyn. She has done very well with no fever no significant shortness of breath for more than 24 hours. Patient was subsequently discharged home on oral Levaquin and clindamycin. She was not requiring much pain medication but was given as Percocet 5/325 for breakthrough pain at home.  Consults: None  Significant Diagnostic Studies: labs: Serial CBCs and CMP is where checked most are within normal limits.  Treatments: IV hydration, antibiotics: vancomycin, Zosyn and Levaquin and analgesia: Dilaudid  Discharge Exam: Blood pressure 91/48, pulse 89, temperature 98 F (36.7 C), temperature source Oral, resp. rate 20, height 5\' 4"  (1.626 m), weight 52 kg (114 lb 10.2 oz), last menstrual period 04/28/2014, SpO2 96 %, unknown if currently breastfeeding. General appearance: alert, cooperative and no distress Neck: no adenopathy, no carotid bruit, no JVD, supple, symmetrical, trachea midline and thyroid not enlarged, symmetric, no tenderness/mass/nodules Back: symmetric, no curvature. ROM normal. No CVA tenderness. Resp: clear to auscultation bilaterally Chest wall: no tenderness Cardio: regular rate and rhythm, S1, S2  normal, no murmur, click, rub or gallop GI: soft, non-tender; bowel sounds normal; no masses,  no organomegaly Extremities: extremities normal, atraumatic, no cyanosis or edema Pulses: 2+ and symmetric Skin: Skin color, texture, turgor normal. No rashes or lesions Neurologic: Grossly normal  Disposition: 95-DC/txfr to another health care institution with planned acute care hosp IP readmit     Medication List    STOP taking these medications        azithromycin 500 MG tablet  Commonly known as:  ZITHROMAX      TAKE these medications        clindamycin 300 MG capsule  Commonly known as:  CLEOCIN  Take 1 capsule (300 mg total) by mouth 3 (three) times daily.     docusate sodium 100 MG capsule  Commonly known as:  COLACE  Take 100 mg by mouth 2 (two) times daily.     ferrous sulfate 325 (65 FE) MG tablet  Take 1 tablet by mouth daily.     folic acid 1 MG tablet  Commonly known as:  FOLVITE  Take 1 mg by mouth daily.     HYDROcodone-acetaminophen 5-325 MG per tablet  Commonly known as:  NORCO/VICODIN  Take 1 tablet by mouth every 4 (four) hours as needed. pain     ibuprofen 800 MG tablet  Commonly known as:  ADVIL,MOTRIN  Take 800 mg by mouth every 8 (eight) hours as needed. pain     ipratropium-albuterol 0.5-2.5 (3) MG/3ML Soln  Commonly known as:  DUONEB  Inhale 3 mLs into the lungs 2 (two) times daily as needed. Worsening shortness of breath/wheezing     levofloxacin 750 MG tablet  Commonly known as:  LEVAQUIN  Take 1 tablet (750 mg total) by mouth daily.  prenatal multivitamin Tabs tablet  Take 1 tablet by mouth daily at 12 noon.         SignedBarbette Merino 01/16/2015, 9:29 AM  Time spent 34 minutes

## 2015-01-18 LAB — LEGIONELLA ANTIGEN, URINE

## 2015-01-19 LAB — HEMOGLOBINOPATHY EVALUATION
HGB A2 QUANT: 3.8 % — AB (ref 0.7–3.1)
HGB A: 27.7 % — AB (ref 94.0–98.0)
HGB C: 0 %
HGB F QUANT: 5.7 % — AB (ref 0.0–2.0)
HGB S QUANTITAION: 62.8 % — AB

## 2015-02-09 ENCOUNTER — Encounter: Payer: Self-pay | Admitting: Family Medicine

## 2015-02-09 ENCOUNTER — Ambulatory Visit (INDEPENDENT_AMBULATORY_CARE_PROVIDER_SITE_OTHER): Payer: Medicaid Other | Admitting: Family Medicine

## 2015-02-09 VITALS — BP 99/59 | HR 90 | Temp 97.7°F | Resp 16 | Ht 64.0 in | Wt 119.0 lb

## 2015-02-09 DIAGNOSIS — Z789 Other specified health status: Secondary | ICD-10-CM | POA: Diagnosis not present

## 2015-02-09 DIAGNOSIS — Z23 Encounter for immunization: Secondary | ICD-10-CM | POA: Diagnosis not present

## 2015-02-09 DIAGNOSIS — D571 Sickle-cell disease without crisis: Secondary | ICD-10-CM | POA: Diagnosis not present

## 2015-02-09 LAB — COMPLETE METABOLIC PANEL WITH GFR
ALT: 42 U/L — ABNORMAL HIGH (ref 6–29)
AST: 36 U/L — ABNORMAL HIGH (ref 10–30)
Albumin: 4.3 g/dL (ref 3.6–5.1)
Alkaline Phosphatase: 121 U/L — ABNORMAL HIGH (ref 33–115)
BUN: 10 mg/dL (ref 7–25)
CO2: 23 mmol/L (ref 20–31)
Calcium: 9.4 mg/dL (ref 8.6–10.2)
Chloride: 104 mmol/L (ref 98–110)
Creat: 0.44 mg/dL — ABNORMAL LOW (ref 0.50–1.10)
GFR, Est African American: >89 mL/min (ref >=60)
GFR, Est Non African American: >89 mL/min (ref >=60)
Glucose, Bld: 105 mg/dL — ABNORMAL HIGH (ref 65–99)
Potassium: 4 mmol/L (ref 3.5–5.3)
Sodium: 137 mmol/L (ref 135–146)
Total Bilirubin: 1.2 mg/dL (ref 0.2–1.2)
Total Protein: 7.4 g/dL (ref 6.1–8.1)

## 2015-02-09 LAB — POCT URINALYSIS DIP (DEVICE)
BILIRUBIN URINE: NEGATIVE
Glucose, UA: NEGATIVE mg/dL
Ketones, ur: NEGATIVE mg/dL
Leukocytes, UA: NEGATIVE
NITRITE: NEGATIVE
PH: 5.5 (ref 5.0–8.0)
Protein, ur: NEGATIVE mg/dL
Specific Gravity, Urine: 1.015 (ref 1.005–1.030)
UROBILINOGEN UA: 0.2 mg/dL (ref 0.0–1.0)

## 2015-02-09 LAB — FERRITIN: FERRITIN: 1042 ng/mL — AB (ref 10–291)

## 2015-02-09 MED ORDER — FOLIC ACID 1 MG PO TABS: 1.0000 mg | ORAL_TABLET | Freq: Every day | ORAL | Status: DC

## 2015-02-09 MED ORDER — ACETAMINOPHEN 500 MG PO TABS: 500.0000 mg | ORAL_TABLET | Freq: Four times a day (QID) | ORAL | Status: DC | PRN

## 2015-02-09 NOTE — Patient Instructions (Addendum)
Discussed [facility] at length. Hours of operation are M-F 8:30 am-8:00 pm. Please call before 1 pm to avoid treatment failure. Sickle Cell Anemia, Adult Sickle cell anemia is a condition in which red blood cells have an abnormal "sickle" shape. This abnormal shape shortens the cells' life span, which results in a lower than normal concentration of red blood cells in the blood. The sickle shape also causes the cells to clump together and block free blood flow through the blood vessels. As a result, the tissues and organs of the body do not receive enough oxygen. Sickle cell anemia causes organ damage and pain and increases the risk of infection. CAUSES  Sickle cell anemia is a genetic disorder. Those who receive two copies of the gene have the condition, and those who receive one copy have the trait. RISK FACTORS The sickle cell gene is most common in people whose families originated in [place]. Other areas of the globe where sickle cell trait occurs include the [place], [place] and [place], and the [place].  SIGNS AND SYMPTOMS  Pain, especially in the extremities, back, chest, or abdomen (common). The pain may start suddenly or may develop following an illness, especially if there is dehydration. Pain can also occur due to overexertion or exposure to extreme temperature changes.  Frequent severe bacterial infections, especially certain types of pneumonia and meningitis.  Pain and swelling in the hands and feet.  Decreased activity.   Loss of appetite.   Change in behavior.  Headaches.  Seizures.  Shortness of breath or difficulty breathing.  Vision changes.  Skin ulcers. Those with the trait may not have symptoms or they may have mild symptoms.  DIAGNOSIS  Sickle cell anemia is diagnosed with blood tests that demonstrate the genetic trait. It is often diagnosed during the newborn period, due to mandatory testing nationwide. A  variety of blood tests, X-rays, CT scans, MRI scans, ultrasounds, and lung function tests may also be done to monitor the condition. TREATMENT  Sickle cell anemia may be treated with:  Medicines. You may be given pain medicines, antibiotic medicines (to treat and prevent infections) or medicines to increase the production of certain types of hemoglobin.  Fluids.  Oxygen.  Blood transfusions. HOME CARE INSTRUCTIONS   Drink enough fluid to keep your urine clear or pale yellow. Increase your fluid intake in hot weather and during exercise.  Do not smoke. Smoking lowers oxygen levels in the blood.   Only take over-the-counter or prescription medicines for pain, fever, or discomfort as directed by your health care provider.  Take antibiotics as directed by your health care provider. Make sure you finish them it even if you start to feel better.   Take supplements as directed by your health care provider.   Consider wearing a medical alert bracelet. This tells anyone caring for you in an emergency of your condition.   When traveling, keep your medical information, health care provider's names, and the medicines you take with you at all times.   If you develop a fever, do not take medicines to reduce the fever right away. This could cover up a problem that is developing. Notify your health care provider.  Keep all follow-up appointments with your health care provider. Sickle cell anemia requires regular medical care. SEEK MEDICAL CARE IF: You have a fever. SEEK IMMEDIATE MEDICAL CARE IF:   You feel dizzy or faint.   You have new abdominal pain, especially on the  left side near the stomach area.   You develop a persistent, often uncomfortable and painful penile erection (priapism). If this is not treated immediately it will lead to impotence.   You have numbness your arms or legs or you have a hard time moving them.   You have a hard time with speech.   You have a fever  or persistent symptoms for more than 2-3 days.   You have a fever and your symptoms suddenly get worse.   You have signs or symptoms of infection. These include:   Chills.   Abnormal tiredness (lethargy).   Irritability.   Poor eating.   Vomiting.   You develop pain that is not helped with medicine.   You develop shortness of breath.  You have pain in your chest.   You are coughing up pus-like or bloody sputum.   You develop a stiff neck.  Your feet or hands swell or have pain.  Your abdomen appears bloated.  You develop joint pain. MAKE SURE YOU:  Understand these instructions. Document Released: 08/09/2005 Document Revised: 09/15/2013 Document Reviewed: 12/11/2012 [facility] Patient Information 2015 [place], [place]. This information is not intended to replace advice given to you by your health care provider. Make sure you discuss any questions you have with your health care provider.

## 2015-02-09 NOTE — Progress Notes (Signed)
Subjective:    Patient ID: Joy Ferguson, female    DOB: 08-Apr-1986, 29 y.o.   MRN: [id number]  HPI [name], a 29 year old female with sickle cell anemia, HbSS presents to establish care. Patient is accompanied by interpreter, she primarily speaks arabic. She states that she has not had a primary provider since leaving [place]. She was recently hospitalized on 01/14/2015 for health care associated  pneumonia. She reports that she completed antibiotic. She reports low back pain today, pain intensity is 4/10. She states that she has not used any OTC interventions to alleviate pain. Patient is currently breastfeeding, she recently delivered. She denies headache, fever, shortness of breath, chest pains, nausea, vomiting, or diarrhea.  Past Medical History  Diagnosis Date  . GERD (gastroesophageal reflux disease)   . Sickle cell anemia   . Anemia    Social History   Social History  . Marital Status: Married    Spouse Name: N/A  . Number of Children: N/A  . Years of Education: N/A   Occupational History  . Not on file.   Social History Main Topics  . Smoking status: Never Smoker   . Smokeless tobacco: Not on file  . Alcohol Use: No  . Drug Use: No  . Sexual Activity: Yes   Other Topics Concern  . Not on file   Social History Narrative  No Known Allergies  There is no immunization history on file for this patient. Review of Systems  Constitutional: Negative.   HENT: Negative.   Eyes: Negative.   Respiratory: Negative for shortness of breath.   Cardiovascular: Negative.  Negative for chest pain, palpitations and leg swelling.  Endocrine: Negative.   Genitourinary: Negative.   Musculoskeletal: Positive for back pain.  Skin: Negative.   Allergic/Immunologic: Negative.   Neurological: Negative.   Hematological: Negative.   Psychiatric/Behavioral: Negative.        Objective:   Physical Exam  Constitutional: She is oriented to person, place, and time. She appears  well-developed and well-nourished.  HENT:  Head: Normocephalic and atraumatic.  Right Ear: External ear normal.  Left Ear: External ear normal.  Nose: Nose normal.  Mouth/Throat: Oropharynx is clear and moist.  Eyes: Conjunctivae and EOM are normal. Pupils are equal, round, and reactive to light.  Neck: Normal range of motion. Neck supple.  Pulmonary/Chest: Effort normal and breath sounds normal.  Abdominal: Soft. Bowel sounds are normal.  Musculoskeletal: Normal range of motion.       Lumbar back: She exhibits pain. She exhibits normal range of motion and no tenderness.  Neurological: She is alert and oriented to person, place, and time. She has normal reflexes.  Skin: Skin is warm.  Psychiatric: She has a normal mood and affect. Her behavior is normal. Judgment and thought content normal.         BP 99/59 mmHg  Pulse 90  Temp(Src) 97.7 F (36.5 C) (Oral)  Resp 16  Ht 5\' 4"  (1.626 m)  Wt 119 lb (53.978 kg)  BMI 20.42 kg/m2  LMP 04/28/2014 Assessment & Plan:  1. Hb-SS disease without crisis Sickle cell disease -   Continue folic acid 1 mg daily to prevent aplastic bone marrow crises.   Pulmonary evaluation - Patient denies severe recurrent wheezes, shortness of breath with exercise, or persistent cough. If these symptoms develop, pulmonary function tests with spirometry will be ordered, and if abnormal, plan on referral to Pulmonology for further evaluation.  Cardiac - Routine screening for pulmonary hypertension  is not recommended.  Eye - High risk of proliferative retinopathy. Annual eye exam with retinal exam recommended to patient. Patient has never had an eye examination. Will send referral    Immunization status - She will receive influenza vaccination on today. Yearly influenza vaccination is recommended, as well as being up to date with Meningococcal and Pneumococcal vaccines. Requested that she bring copies of past immunizations.    Acute and chronic painful  episodes - Patient does not utilize opiate medications for pain control. She states that she mostly uses Tylenol or Ibuprofen for pain episodes. Recommend Tylenol 500 mg every 6 hours as needed for pain control while breastfeeding.    Iron overload from chronic transfusion.  Reviewed previous ferritin level, she has had blood transfusions in the past  Vitamin D deficiency - Will check vitamin D level   - acetaminophen (TYLENOL) 500 MG tablet; Take 1 tablet (500 mg total) by mouth every 6 (six) hours as needed.  Dispense: 60 tablet; Refill: 0 - folic acid (FOLVITE) 1 MG tablet; Take 1 tablet (1 mg total) by mouth daily.  Dispense: 30 tablet; Refill: 11 - POCT urinalysis dipstick - Ferritin - Vitamin D, 25-hydroxy - CBC with Differential - COMPLETE METABOLIC PANEL WITH GFR  2. Need for immunization against influenza  - Flu Vaccine QUAD 36+ mos IM (Fluarix)  RTC: 3 months for a follow-up with Dr. Doreene Burke  The patient was given clear instructions to go to ER or return to medical center if symptoms do not improve, worsen or new problems develop. The patient verbalized understanding. Will notify patient with laboratory results. Dorena Dew, FNP

## 2015-02-10 ENCOUNTER — Telehealth: Payer: Self-pay | Admitting: Family Medicine

## 2015-02-10 ENCOUNTER — Encounter: Payer: Self-pay | Admitting: Family Medicine

## 2015-02-10 DIAGNOSIS — E559 Vitamin D deficiency, unspecified: Secondary | ICD-10-CM

## 2015-02-10 LAB — CBC WITH DIFFERENTIAL/PLATELET
BASOS ABS: 0.1 10*3/uL (ref 0.0–0.1)
BASOS PCT: 1 % (ref 0–1)
EOS ABS: 0.6 10*3/uL (ref 0.0–0.7)
Eosinophils Relative: 6 % — ABNORMAL HIGH (ref 0–5)
HCT: 28 % — ABNORMAL LOW (ref 36.0–46.0)
Hemoglobin: 9.6 g/dL — ABNORMAL LOW (ref 12.0–15.0)
LYMPHS ABS: 3.7 10*3/uL (ref 0.7–4.0)
Lymphocytes Relative: 35 % (ref 12–46)
MCH: 28.6 pg (ref 26.0–34.0)
MCHC: 34.3 g/dL (ref 30.0–36.0)
MCV: 83.3 fL (ref 78.0–100.0)
MPV: 9.5 fL (ref 8.6–12.4)
Monocytes Absolute: 0.8 10*3/uL (ref 0.1–1.0)
Monocytes Relative: 8 % (ref 3–12)
NEUTROS PCT: 50 % (ref 43–77)
Neutro Abs: 5.3 10*3/uL (ref 1.7–7.7)
PLATELETS: 683 10*3/uL — AB (ref 150–400)
RBC: 3.36 MIL/uL — AB (ref 3.87–5.11)
RDW: 19.1 % — ABNORMAL HIGH (ref 11.5–15.5)
WBC: 10.6 10*3/uL — AB (ref 4.0–10.5)

## 2015-02-10 LAB — VITAMIN D 25 HYDROXY (VIT D DEFICIENCY, FRACTURES): Vit D, 25-Hydroxy: 27 ng/mL — ABNORMAL LOW (ref 30–100)

## 2015-02-10 MED ORDER — VITAMIN D 50 MCG (2000 UT) PO TABS: 2000.0000 [IU] | ORAL_TABLET | Freq: Every day | ORAL | Status: DC

## 2015-02-10 NOTE — Telephone Encounter (Signed)
Reviewed labs, vitamin D deficiency. Will start Vitamin D supplement 2000 IU daily.   Meds ordered this encounter  Medications  . Cholecalciferol (VITAMIN D) 2000 UNITS tablet    Sig: Take 1 tablet (2,000 Units total) by mouth daily.    Dispense:  30 tablet    Refill:  11    Hollis,Lachina M, FNP

## 2015-02-12 NOTE — Telephone Encounter (Signed)
Left message advising of low vitamin D and to take vitamin D supplement which was sent to [facility]. Asked patient to return call if any questions and left call back number. Thanks!

## 2015-05-06 ENCOUNTER — Telehealth: Payer: Self-pay | Admitting: Internal Medicine

## 2015-05-06 NOTE — Telephone Encounter (Signed)
Left message for patient to call to reschedule cancelled appointment from 05/11/15.

## 2015-05-11 ENCOUNTER — Ambulatory Visit: Payer: Medicaid Other | Admitting: Internal Medicine

## 2015-09-28 ENCOUNTER — Ambulatory Visit (INDEPENDENT_AMBULATORY_CARE_PROVIDER_SITE_OTHER): Payer: Self-pay | Admitting: Family Medicine

## 2015-09-28 VITALS — BP 130/80 | HR 94 | Temp 99.3°F | Resp 20 | Ht 63.0 in | Wt 142.0 lb

## 2015-09-28 DIAGNOSIS — G8929 Other chronic pain: Secondary | ICD-10-CM

## 2015-09-28 DIAGNOSIS — R1013 Epigastric pain: Secondary | ICD-10-CM

## 2015-09-28 NOTE — Assessment & Plan Note (Addendum)
Vitals stable, pt in NAD.  Reproducible pain without chest pain or RF.  No evidence of pancreatitis as no radiation to the back and did eat today.  Trial of GI cocktail which helped immensely.  Recommend Zantac or Pepcid and if continues, may need lab work and EGD.

## 2015-09-28 NOTE — Progress Notes (Signed)
Joy Ferguson is a 30 y.o. female who presents today for digestive issues.  Digestive issue - Ongoing now for today for 3 issues with some abdominal pain.  + non bilious non bloody emesis x 2-3 times.  No diarrhea, fever, chills, recent travel.   Is able to eat but has some pain.  Located epigastric with no radiation to the back.  Denies CP or SOB.  Has not tried anything to date but does have Hx of GERD  Past Medical History  Diagnosis Date  . GERD (gastroesophageal reflux disease)   . Sickle cell anemia (HCC)   . Anemia     History  Smoking status  . Never Smoker   Smokeless tobacco  . Not on file    Family History  Problem Relation Age of Onset  . Hypertension Father   . Diabetes Paternal Grandmother   . Hypertension Paternal Grandmother     Current Outpatient Prescriptions on File Prior to Visit  Medication Sig Dispense Refill  . Prenatal Vit-Fe Fumarate-FA (PRENATAL MULTIVITAMIN) TABS tablet Take 1 tablet by mouth daily at 12 noon.    Marland Kitchen acetaminophen (TYLENOL) 500 MG tablet Take 1 tablet (500 mg total) by mouth every 6 (six) hours as needed. (Patient not taking: Reported on 09/28/2015) 60 tablet 0  . Cholecalciferol (VITAMIN D) 2000 UNITS tablet Take 1 tablet (2,000 Units total) by mouth daily. (Patient not taking: Reported on 09/28/2015) 30 tablet 11  . folic acid (FOLVITE) 1 MG tablet Take 1 tablet (1 mg total) by mouth daily. (Patient not taking: Reported on 09/28/2015) 30 tablet 11  . ibuprofen (ADVIL,MOTRIN) 800 MG tablet Take 800 mg by mouth every 8 (eight) hours as needed. Reported on 09/28/2015    . ipratropium-albuterol (DUONEB) 0.5-2.5 (3) MG/3ML SOLN Inhale 3 mLs into the lungs 2 (two) times daily as needed. Worsening shortness of breath/wheezing    . levofloxacin (LEVAQUIN) 750 MG tablet Take 1 tablet (750 mg total) by mouth daily. (Patient not taking: Reported on 02/09/2015) 14 tablet 0   No current facility-administered medications on file prior to visit.    ROS: Per  HPI.  All other systems reviewed and are negative.   Physical Exam Filed Vitals:   09/28/15 1750  BP: 130/80  Pulse: 94  Temp: 99.3 F (37.4 C)  Resp: 20    Physical Examination: General appearance - alert, well appearing, and in no distress Chest - clear to auscultation, no wheezes, rales or rhonchi, symmetric air entry Heart - normal rate and regular rhythm Abdomen - Soft, NABS, negative Murphy's sign/Negative Rovsing sign/Negative McBurney point, no HSM.  TTP epigastric with no guarding or rebound.  No LLQ/RLQ/suprapubic/RUQ/LUQ TTP.      Chemistry      Component Value Date/Time   NA 137 02/09/2015 1549   NA 136 09/25/2014 1612   K 4.0 02/09/2015 1549   K 3.6 09/25/2014 1612   CL 104 02/09/2015 1549   CO2 23 02/09/2015 1549   CO2 22 09/25/2014 1612   BUN 10 02/09/2015 1549   BUN 4.1* 09/25/2014 1612   CREATININE 0.44* 02/09/2015 1549   CREATININE 0.43* 01/16/2015 0600   CREATININE 0.5* 09/25/2014 1612      Component Value Date/Time   CALCIUM 9.4 02/09/2015 1549   CALCIUM 9.1 09/25/2014 1612   ALKPHOS 121* 02/09/2015 1549   ALKPHOS 106 09/25/2014 1612   AST 36* 02/09/2015 1549   AST 39* 09/25/2014 1612   ALT 42* 02/09/2015 1549   ALT 37 09/25/2014 1612  BILITOT 1.2 02/09/2015 1549   BILITOT 1.28* 09/25/2014 1612      Lab Results  Component Value Date   WBC 10.6* 02/09/2015   HGB 9.6* 02/09/2015   HCT 28.0* 02/09/2015   MCV 83.3 02/09/2015   PLT 683* 02/09/2015   Lab Results  Component Value Date   TSH 0.741 01/14/2015   No results found for: HGBA1C

## 2017-03-15 IMAGING — CT CT ABD-PELV W/ CM
2 of 4 series · 16 of 46 positions shown, 18 images · IV contrast (OMNIPAQUE)
Comparison: Chest x-ray and chest CT 01/14/2015

CLINICAL DATA: History of sickle cell disease. Recently gave birth.
Develops pneumonia. Required hospitalization for 3 days. Worsening
shortness of breath and pain right side of the rib cage.
Leukocytosis, nausea, vomiting.

EXAM:
CT ABDOMEN AND PELVIS WITH CONTRAST
TECHNIQUE: Multidetector CT imaging of the abdomen and pelvis was performed
using the standard protocol following bolus administration of
intravenous contrast.
CONTRAST:  100mL OMNIPAQUE IOHEXOL 300 MG/ML  SOLN

[Series 2: rtn a/p with · axial · 0.67mm/px · z∈[-628,-198]mm · 13 of 94 slices shown, 15 images]
[im 4/94  soft-tissue]
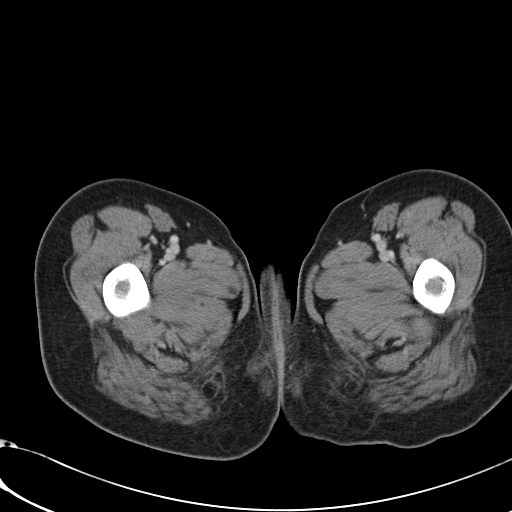
[im 4/94  bone]
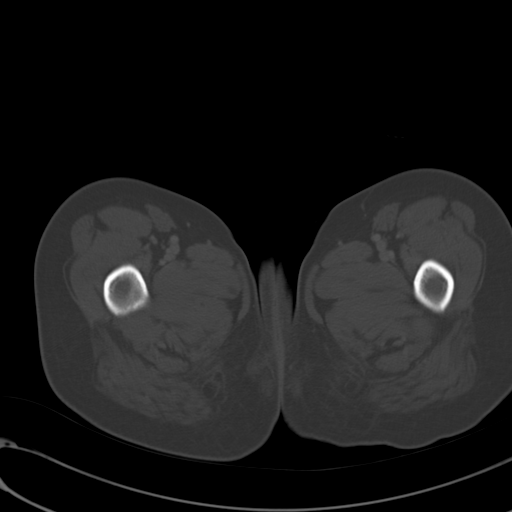
[im 12/94  soft-tissue]
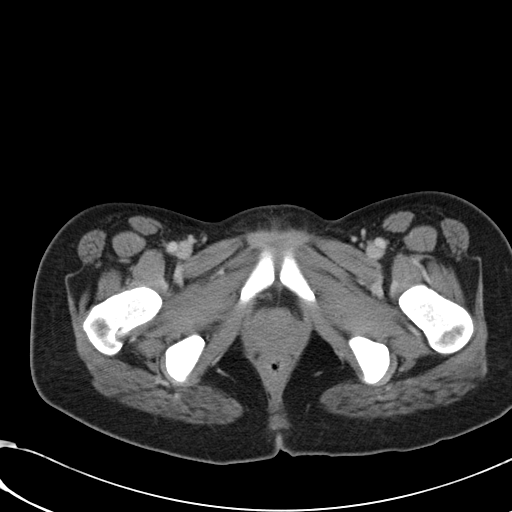
[im 19/94  soft-tissue]
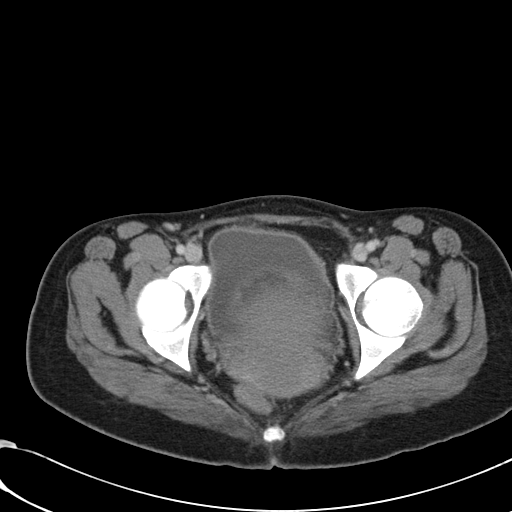
[im 27/94  soft-tissue]
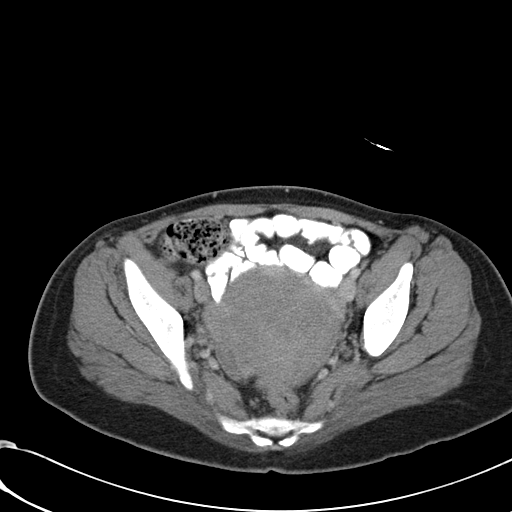
[im 34/94  soft-tissue]
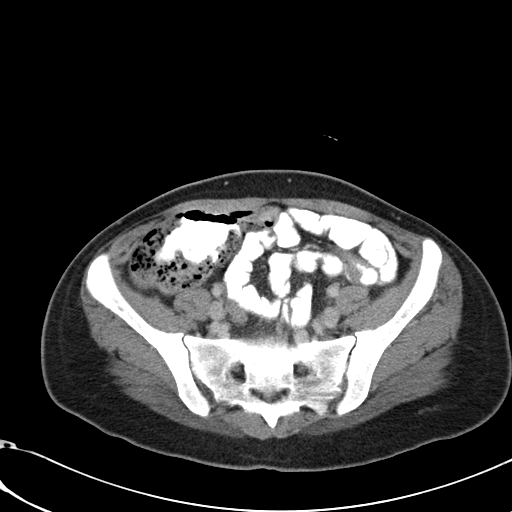
[im 41/94  soft-tissue]
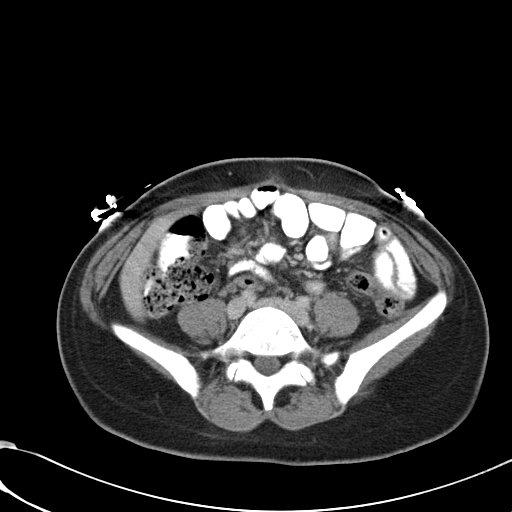
[im 49/94  soft-tissue]
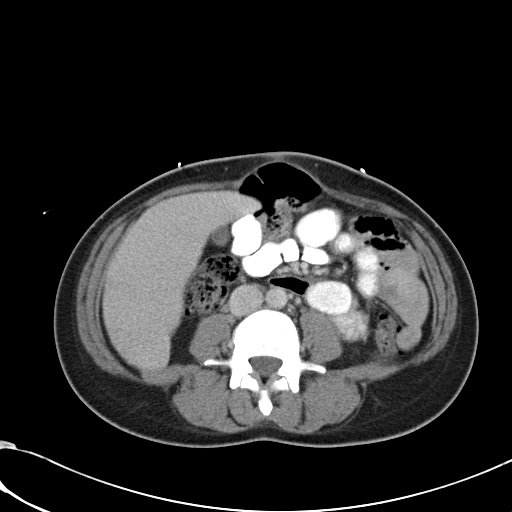
[im 53/94  soft-tissue]
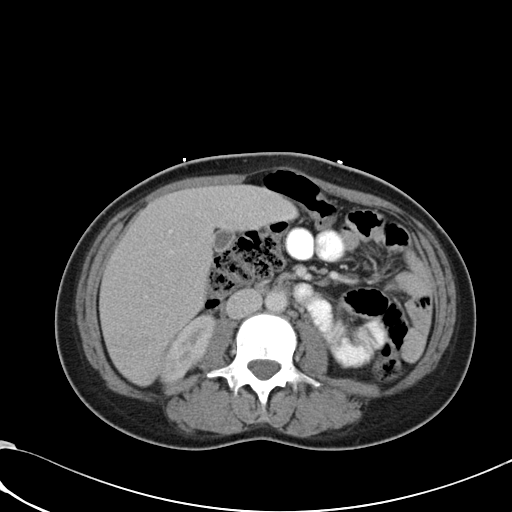
[im 60/94  soft-tissue]
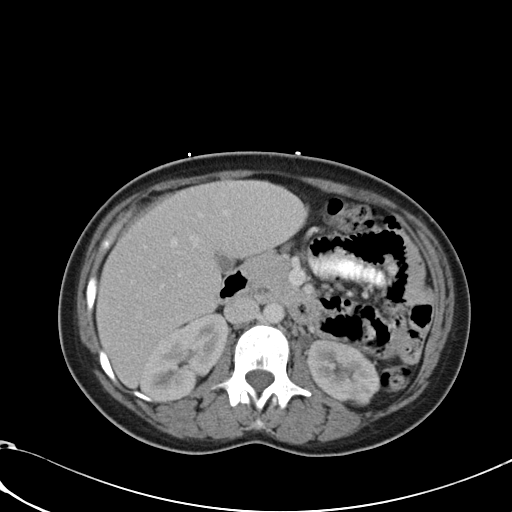
[im 60/94  bone]
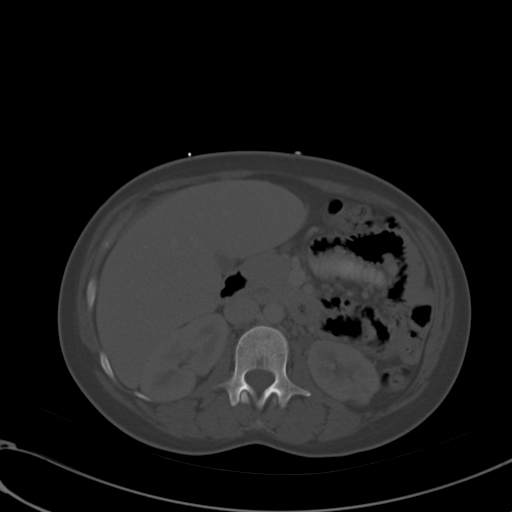
[im 67/94  soft-tissue]
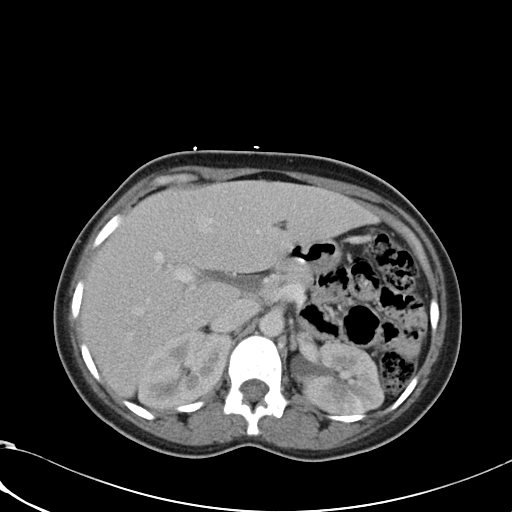
[im 75/94  soft-tissue]
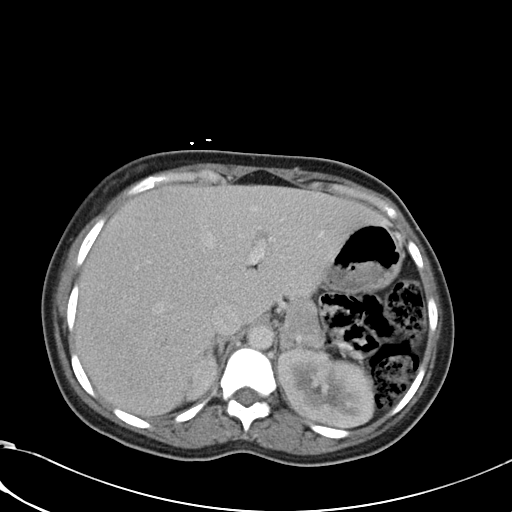
[im 82/94  soft-tissue]
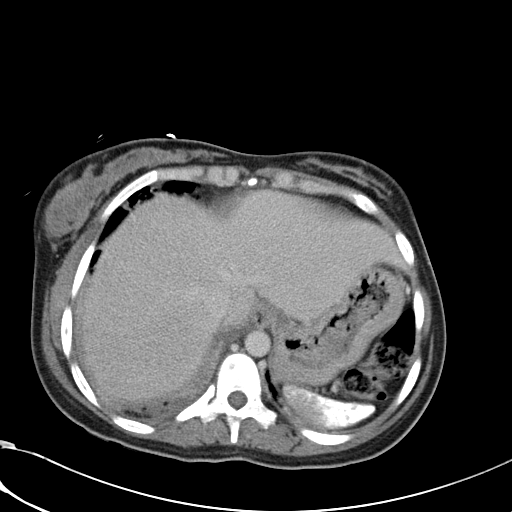
[im 90/94  soft-tissue]
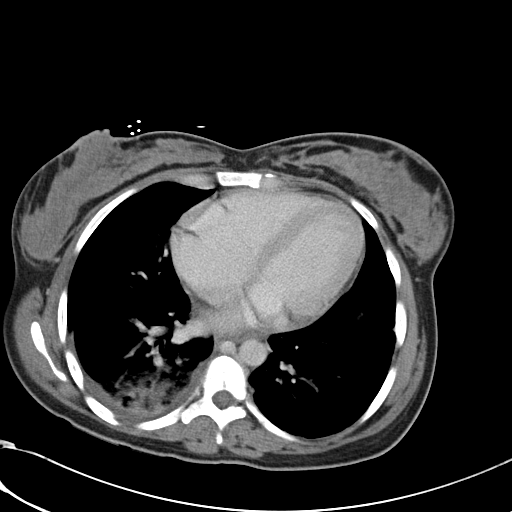

[Series 602: coronal · coronal · 0.91mm/px · 3 of 64 slices shown]
[im 22/64  soft-tissue]
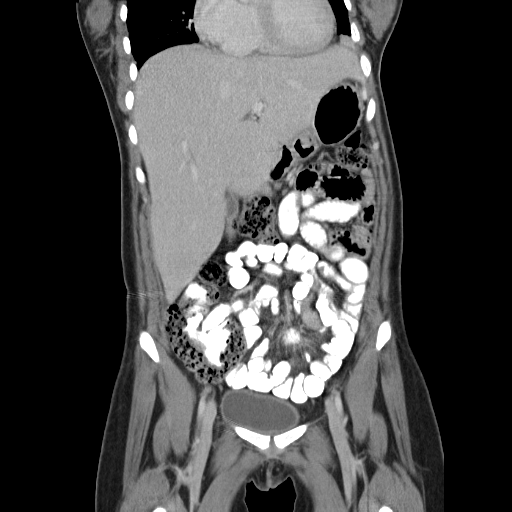
[im 29/64  soft-tissue]
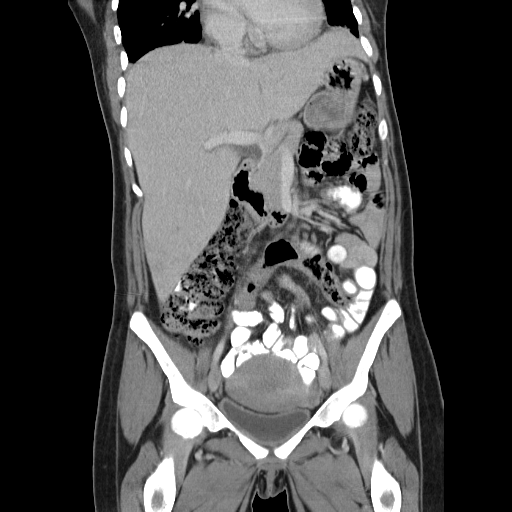
[im 36/64  soft-tissue]
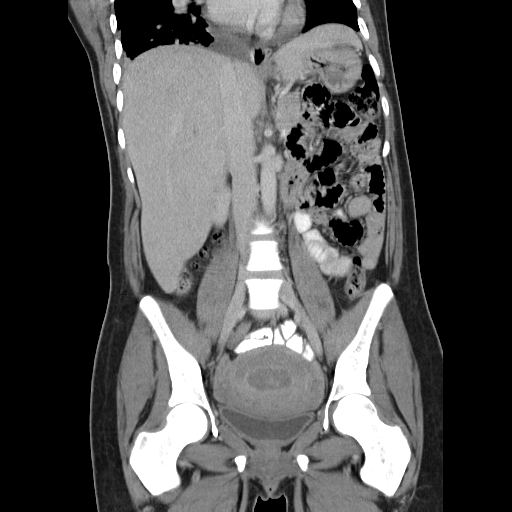

[16 of 46 positions shown; findings below may reference images not displayed]

FINDINGS: Lower chest: There is a right-sided pleural effusion. Bibasilar lung
densities are consistent with infectious process. Heart size is
normal. Trace pericardial effusion.

Upper abdomen: The liver is significantly enlarged. An indeterminate
low-attenuation lesion is identified in the posterior segment of the
right hepatic lobe measuring 1.5 cm. The spleen is absent. No focal
abnormality identified within the pancreas, adrenal glands. A right
renal cyst is present. Suspect intrarenal calculus in the left
kidney versus parenchymal calcification. The gallbladder is present.

Gastrointestinal tract: The stomach has a normal appearance. Small
bowel loops have a normal appearance. Colonic loops are normal in
appearance. The appendix is well seen and has a normal appearance.

Pelvis: The uterus is enlarged following recent delivery by history.
No adnexal mass or free pelvic fluid identified.

Retroperitoneum: No retroperitoneal or mesenteric adenopathy.

Abdominal wall: Unremarkable.

Osseous structures: Unremarkable.
IMPRESSION: 1. Right-sided pleural effusion and bibasilar lung opacities
consistent with infection.
2. Trace pericardial effusion.
3. Significant hepatomegaly. Small indeterminate lesion within the
right hepatic lobe, 1.5 cm possibly representing hemangioma.
4. Suspect intrarenal calculus left kidney.  No obstruction.
5. Right renal cyst.
6. Normal appendix.
7. Postpartum uterus.

## 2017-05-15 NOTE — L&D Delivery Note (Addendum)
Delivery Note At 1:03 PM a viable female was delivered via Vaginal, Spontaneous (Presentation:OA to ROA).  APGAR: 7, 8; weight 5 lb 5 oz (2410 g).   Placenta status: complete.  Cord: 3v with the following complications: none.  Cord pH: pending.  Anesthesia:  Epidural Episiotomy: None Lacerations: 1st degree;Periurethral (female circumcision present) Suture Repair: 3.0 chromic Est. Blood Loss (mL): 485 NICU present for heavy meconium staining Dr. Alwyn Pea was present.  Mom to postpartum.  Baby to NICU for respiratory support and to evaluate for meconium apsiration syndrome.  Baby boy for OP circ. IUD planned for contraception.  Altha Harm, CNM 04/12/2018, 3:10 PM

## 2017-06-21 ENCOUNTER — Ambulatory Visit: Payer: Self-pay

## 2017-06-21 ENCOUNTER — Inpatient Hospital Stay (HOSPITAL_COMMUNITY): Payer: Medicaid Other

## 2017-06-21 ENCOUNTER — Encounter (HOSPITAL_COMMUNITY): Payer: Self-pay | Admitting: *Deleted

## 2017-06-21 ENCOUNTER — Inpatient Hospital Stay (HOSPITAL_COMMUNITY)
Admission: AD | Admit: 2017-06-21 | Discharge: 2017-06-21 | Disposition: A | Discharge status: Home / Self Care | Payer: Medicaid Other | Source: Ambulatory Visit | Attending: Obstetrics & Gynecology | Admitting: Obstetrics & Gynecology

## 2017-06-21 DIAGNOSIS — Z9889 Other specified postprocedural states: Secondary | ICD-10-CM | POA: Diagnosis not present

## 2017-06-21 DIAGNOSIS — Z8249 Family history of ischemic heart disease and other diseases of the circulatory system: Secondary | ICD-10-CM | POA: Diagnosis not present

## 2017-06-21 DIAGNOSIS — Z3A01 Less than 8 weeks gestation of pregnancy: Secondary | ICD-10-CM | POA: Insufficient documentation

## 2017-06-21 DIAGNOSIS — O98811 Other maternal infectious and parasitic diseases complicating pregnancy, first trimester: Secondary | ICD-10-CM | POA: Insufficient documentation

## 2017-06-21 DIAGNOSIS — O98819 Other maternal infectious and parasitic diseases complicating pregnancy, unspecified trimester: Secondary | ICD-10-CM

## 2017-06-21 DIAGNOSIS — B3731 Acute candidiasis of vulva and vagina: Secondary | ICD-10-CM

## 2017-06-21 DIAGNOSIS — B373 Candidiasis of vulva and vagina: Secondary | ICD-10-CM | POA: Insufficient documentation

## 2017-06-21 DIAGNOSIS — O4691 Antepartum hemorrhage, unspecified, first trimester: Secondary | ICD-10-CM | POA: Insufficient documentation

## 2017-06-21 DIAGNOSIS — Z3201 Encounter for pregnancy test, result positive: Secondary | ICD-10-CM | POA: Diagnosis not present

## 2017-06-21 DIAGNOSIS — D571 Sickle-cell disease without crisis: Secondary | ICD-10-CM | POA: Insufficient documentation

## 2017-06-21 DIAGNOSIS — R109 Unspecified abdominal pain: Secondary | ICD-10-CM | POA: Diagnosis not present

## 2017-06-21 DIAGNOSIS — O3481 Maternal care for other abnormalities of pelvic organs, first trimester: Secondary | ICD-10-CM | POA: Diagnosis not present

## 2017-06-21 DIAGNOSIS — Z833 Family history of diabetes mellitus: Secondary | ICD-10-CM | POA: Diagnosis not present

## 2017-06-21 DIAGNOSIS — O3680X0 Pregnancy with inconclusive fetal viability, not applicable or unspecified: Secondary | ICD-10-CM | POA: Insufficient documentation

## 2017-06-21 DIAGNOSIS — O99011 Anemia complicating pregnancy, first trimester: Secondary | ICD-10-CM | POA: Diagnosis not present

## 2017-06-21 DIAGNOSIS — O209 Hemorrhage in early pregnancy, unspecified: Secondary | ICD-10-CM | POA: Diagnosis not present

## 2017-06-21 DIAGNOSIS — O26891 Other specified pregnancy related conditions, first trimester: Secondary | ICD-10-CM | POA: Insufficient documentation

## 2017-06-21 DIAGNOSIS — O26899 Other specified pregnancy related conditions, unspecified trimester: Secondary | ICD-10-CM

## 2017-06-21 LAB — URINALYSIS, ROUTINE W REFLEX MICROSCOPIC
BILIRUBIN URINE: NEGATIVE
Glucose, UA: NEGATIVE mg/dL
KETONES UR: NEGATIVE mg/dL
LEUKOCYTES UA: NEGATIVE
NITRITE: NEGATIVE
PROTEIN: NEGATIVE mg/dL
Specific Gravity, Urine: 1.011 (ref 1.005–1.030)
pH: 5 (ref 5.0–8.0)

## 2017-06-21 LAB — CBC
HCT: 26.3 % — ABNORMAL LOW (ref 36.0–46.0)
Hemoglobin: 9.1 g/dL — ABNORMAL LOW (ref 12.0–15.0)
MCH: 29.3 pg (ref 26.0–34.0)
MCHC: 34.6 g/dL (ref 30.0–36.0)
MCV: 84.6 fL (ref 78.0–100.0)
Platelets: 490 10*3/uL — ABNORMAL HIGH (ref 150–400)
RBC: 3.11 MIL/uL — ABNORMAL LOW (ref 3.87–5.11)
RDW: 18 % — AB (ref 11.5–15.5)
WBC: 13.3 10*3/uL — ABNORMAL HIGH (ref 4.0–10.5)

## 2017-06-21 LAB — WET PREP, GENITAL
Clue Cells Wet Prep HPF POC: NONE SEEN
Sperm: NONE SEEN
Trich, Wet Prep: NONE SEEN

## 2017-06-21 LAB — ABO/RH: ABO/RH(D): O POS

## 2017-06-21 LAB — HCG, QUANTITATIVE, PREGNANCY: HCG, BETA CHAIN, QUANT, S: 65 m[IU]/mL — AB (ref <5)

## 2017-06-21 LAB — POCT PREGNANCY, URINE: PREG TEST UR: POSITIVE — AB

## 2017-06-21 MED ORDER — TERCONAZOLE 0.4 % VA CREA: 1.0000 | TOPICAL_CREAM | Freq: Every day | VAGINAL | 0 refills | Status: DC

## 2017-06-21 NOTE — MAU Provider Note (Signed)
History     CSN: [id number]  Arrival date and time: 06/21/17 1742  Chief Complaint  Patient presents with  . Vaginal Bleeding   HPI Joy Ferguson is a 32 y.o. [id number] at [redacted]w[redacted]d who presents with lower abdominal cramping. She states she was seen at an urgent care yesterday and told she was pregnant. She states she went to the bathroom today and passed a large clot. She has had brown discharge since then. She also has lower abdominal cramping that she rates a 5/10 and has not tried anything for the pain.   OB History    Gravida Para Term Preterm AB Living   4 1   1 2 1    SAB TAB Ectopic Multiple Live Births   2       1      Past Medical History:  Diagnosis Date  . Anemia   . GERD (gastroesophageal reflux disease)   . Sickle cell anemia (HCC)     Past Surgical History:  Procedure Laterality Date  . VAGINA SURGERY      Family History  Problem Relation Age of Onset  . Hypertension Father   . Diabetes Paternal Grandmother   . Hypertension Paternal Grandmother     Social History   Tobacco Use  . Smoking status: Never Smoker  Substance Use Topics  . Alcohol use: No  . Drug use: No    Allergies: No Known Allergies  Medications Prior to Admission  Medication Sig Dispense Refill Last Dose  . Prenatal Vit-Fe Fumarate-FA (PRENATAL MULTIVITAMIN) TABS tablet Take 1 tablet by mouth daily at 12 noon.   06/20/2017 at Unknown time  . acetaminophen (TYLENOL) 500 MG tablet Take 1 tablet (500 mg total) by mouth every 6 (six) hours as needed. (Patient not taking: Reported on 09/28/2015) 60 tablet 0 Not Taking  . Cholecalciferol (VITAMIN D) 2000 UNITS tablet Take 1 tablet (2,000 Units total) by mouth daily. (Patient not taking: Reported on 09/28/2015) 30 tablet 11 Not Taking  . folic acid (FOLVITE) 1 MG tablet Take 1 tablet (1 mg total) by mouth daily. (Patient not taking: Reported on 09/28/2015) 30 tablet 11 Not Taking  . ibuprofen (ADVIL,MOTRIN) 800 MG tablet Take 800 mg by mouth every 8  (eight) hours as needed. Reported on 09/28/2015   Not Taking  . ipratropium-albuterol (DUONEB) 0.5-2.5 (3) MG/3ML SOLN Inhale 3 mLs into the lungs 2 (two) times daily as needed. Worsening shortness of breath/wheezing    at Unknown time  . levofloxacin (LEVAQUIN) 750 MG tablet Take 1 tablet (750 mg total) by mouth daily. (Patient not taking: Reported on 02/09/2015) 14 tablet 0 Not Taking    Review of Systems  Constitutional: Negative.  Negative for fatigue and fever.  HENT: Negative.   Respiratory: Negative.  Negative for shortness of breath.   Cardiovascular: Negative.  Negative for chest pain.  Gastrointestinal: Positive for abdominal pain. Negative for constipation, diarrhea, nausea and vomiting.  Genitourinary: Positive for vaginal discharge. Negative for dysuria.  Neurological: Negative.  Negative for dizziness and headaches.   Physical Exam   Blood pressure (!) 105/57, pulse 88, temperature 98.2 F (36.8 C), resp. rate 18, height 5\' 1"  (1.549 m), weight 139 lb (63 kg), last menstrual period 04/30/2017, unknown if currently breastfeeding.  Physical Exam  Nursing note and vitals reviewed. Constitutional: She is oriented to person, place, and time. She appears well-developed and well-nourished. No distress.  HENT:  Head: Normocephalic.  Eyes: Pupils are equal, round, and reactive to light.  Cardiovascular: Normal rate, regular rhythm and normal heart sounds.  Respiratory: Effort normal and breath sounds normal. No respiratory distress.  GI: Soft. Bowel sounds are normal. She exhibits no distension. There is no tenderness.  Genitourinary:  Genitourinary Comments: Small amount of brown discharge  Neurological: She is alert and oriented to person, place, and time.  Skin: Skin is warm and dry.  Psychiatric: She has a normal mood and affect. Her behavior is normal. Judgment and thought content normal.    MAU Course  Procedures Results for orders placed or performed during the hospital  encounter of 06/21/17 (from the past 24 hour(s))  Urinalysis, Routine w reflex microscopic     Status: Abnormal   Collection Time: 06/21/17  6:30 PM  Result Value Ref Range   Color, Urine YELLOW YELLOW   APPearance CLEAR CLEAR   Specific Gravity, Urine 1.011 1.005 - 1.030   pH 5.0 5.0 - 8.0   Glucose, UA NEGATIVE NEGATIVE mg/dL   Hgb urine dipstick SMALL (A) NEGATIVE   Bilirubin Urine NEGATIVE NEGATIVE   Ketones, ur NEGATIVE NEGATIVE mg/dL   Protein, ur NEGATIVE NEGATIVE mg/dL   Nitrite NEGATIVE NEGATIVE   Leukocytes, UA NEGATIVE NEGATIVE   RBC / HPF 0-5 0 - 5 RBC/hpf   WBC, UA 0-5 0 - 5 WBC/hpf   Bacteria, UA RARE (A) NONE SEEN   Squamous Epithelial / LPF 0-5 (A) NONE SEEN   Mucus PRESENT   Pregnancy, urine POC     Status: Abnormal   Collection Time: 06/21/17  6:42 PM  Result Value Ref Range   Preg Test, Ur POSITIVE (A) NEGATIVE  Wet prep, genital     Status: Abnormal   Collection Time: 06/21/17  6:45 PM  Result Value Ref Range   Yeast Wet Prep HPF POC PRESENT (A) NONE SEEN   Trich, Wet Prep NONE SEEN NONE SEEN   Clue Cells Wet Prep HPF POC NONE SEEN NONE SEEN   WBC, Wet Prep HPF POC MODERATE (A) NONE SEEN   Sperm NONE SEEN   CBC     Status: Abnormal   Collection Time: 06/21/17  6:51 PM  Result Value Ref Range   WBC 13.3 (H) 4.0 - 10.5 K/uL   RBC 3.11 (L) 3.87 - 5.11 MIL/uL   Hemoglobin 9.1 (L) 12.0 - 15.0 g/dL   HCT 26.3 (L) 36.0 - 46.0 %   MCV 84.6 78.0 - 100.0 fL   MCH 29.3 26.0 - 34.0 pg   MCHC 34.6 30.0 - 36.0 g/dL   RDW 18.0 (H) 11.5 - 15.5 %   Platelets 490 (H) 150 - 400 K/uL  hCG, quantitative, pregnancy     Status: Abnormal   Collection Time: 06/21/17  6:51 PM  Result Value Ref Range   hCG, Beta Chain, Quant, S 65 (H) <5 mIU/mL  ABO/Rh     Status: None   Collection Time: 06/21/17  6:51 PM  Result Value Ref Range   ABO/RH(D)      O POS Performed at [facility], [place], [place] [address]    US Ob Comp Less 14 Wks  Result Date:  06/21/2017 CLINICAL DATA:  Bleeding and cramping, pregnant, quantitative beta HCG = 65 EXAM: OBSTETRIC <14 WK [name] AND TRANSVAGINAL OB US TECHNIQUE: Both transabdominal and transvaginal ultrasound examinations were performed for complete evaluation of the gestation as well as the maternal uterus, adnexal regions, and pelvic cul-de-sac. Transvaginal technique was performed to assess early pregnancy. COMPARISON:  None for this gestation  FINDINGS: Intrauterine gestational sac: Absent Yolk sac:  N/A Embryo:  N/A Cardiac Activity: N/A Heart Rate: N/A  bpm Maternal uterus/adnexae: No intrauterine gestational sac or fluid collection seen. No focal uterine mass. Endometrial complex appears grossly normal without endometrial fluid. RIGHT ovary measures 4.8 x 2.8 x 2.7 cm and contains a cyst 2.7 x 2.4 x 2.0 cm. LEFT ovary normal size and morphology, 1.8 x 2.7 x 1.9 cm. No adnexal masses. Trace free pelvic fluid. IMPRESSION: No intrauterine gestation identified. Findings are compatible with pregnancy of unknown location. Differential diagnosis includes early intrauterine pregnancy too early to visualize, spontaneous abortion, and ectopic pregnancy. Serial quantitative beta hCG and or followup ultrasound recommended to definitively exclude ectopic pregnancy. Electronically Signed   By: Lavonia Dana M.D.   On: 06/21/2017 20:42   US Ob Transvaginal  Result Date: 06/21/2017 CLINICAL DATA:  Bleeding and cramping, pregnant, quantitative beta HCG = 65 EXAM: OBSTETRIC <14 WK [name] AND TRANSVAGINAL OB US TECHNIQUE: Both transabdominal and transvaginal ultrasound examinations were performed for complete evaluation of the gestation as well as the maternal uterus, adnexal regions, and pelvic cul-de-sac. Transvaginal technique was performed to assess early pregnancy. COMPARISON:  None for this gestation FINDINGS: Intrauterine gestational sac: Absent Yolk sac:  N/A Embryo:  N/A Cardiac Activity: N/A Heart Rate: N/A  bpm Maternal uterus/adnexae:  No intrauterine gestational sac or fluid collection seen. No focal uterine mass. Endometrial complex appears grossly normal without endometrial fluid. RIGHT ovary measures 4.8 x 2.8 x 2.7 cm and contains a cyst 2.7 x 2.4 x 2.0 cm. LEFT ovary normal size and morphology, 1.8 x 2.7 x 1.9 cm. No adnexal masses. Trace free pelvic fluid. IMPRESSION: No intrauterine gestation identified. Findings are compatible with pregnancy of unknown location. Differential diagnosis includes early intrauterine pregnancy too early to visualize, spontaneous abortion, and ectopic pregnancy. Serial quantitative beta hCG and or followup ultrasound recommended to definitively exclude ectopic pregnancy. Electronically Signed   By: Lavonia Dana M.D.   On: 06/21/2017 20:42    MDM UA, UPT CBC, HCG ABO/Rh- O Pos Wet prep and gc/chlamydia US OB Transvaginal US OB Comp Less 14 weeks- nothing visualized on ultrasound  Assessment and Plan   1. Pregnancy of unknown anatomic location   2. Vaginal bleeding in pregnancy, first trimester   3. Abdominal pain affecting pregnancy   4. Candidiasis of vagina during pregnancy    -Discharge home in stable condition -Rx for terazole sent to patient's pharmacy -Ectopic and miscarriage precautions discussed -Patient advised to follow-up with [facility] on Saturday for repeat HCG -Patient may return to MAU as needed or if her condition were to change or worsen  Wende Mott CNM 06/21/2017, 8:51 PM

## 2017-06-21 NOTE — Discharge Instructions (Signed)
Safe Medications in Pregnancy  ° °Acne: °Benzoyl Peroxide °Salicylic Acid ° °Backache/Headache: °Tylenol: 2 regular strength every 4 hours OR °             2 Extra strength every 6 hours ° °Colds/Coughs/Allergies: °Benadryl (alcohol free) 25 mg every 6 hours as needed °Breath right strips °Claritin °Cepacol throat lozenges °Chloraseptic throat spray °Cold-Eeze- up to three times per day °Cough drops, alcohol free °Flonase (by prescription only) °Guaifenesin °Mucinex °Robitussin DM (plain only, alcohol free) °Saline nasal spray/drops °Sudafed (pseudoephedrine) & Actifed ** use only after [redacted] weeks gestation and if you do not have high blood pressure °Tylenol °Vicks Vaporub °Zinc lozenges °Zyrtec  ° °Constipation: °Colace °Ducolax suppositories °Fleet enema °Glycerin suppositories °Metamucil °Milk of magnesia °Miralax °Senokot °Smooth move tea ° °Diarrhea: °Kaopectate °Imodium A-D ° °*NO pepto Bismol ° °Hemorrhoids: °Anusol °Anusol HC °Preparation H °Tucks ° °Indigestion: °Tums °Maalox °Mylanta °Zantac  °Pepcid ° °Insomnia: °Benadryl (alcohol free) 25mg every 6 hours as needed °Tylenol PM °Unisom, no Gelcaps ° °Leg Cramps: °Tums °MagGel ° °Nausea/Vomiting:  °Bonine °Dramamine °Emetrol °Ginger extract °Sea bands °Meclizine  °Nausea medication to take during pregnancy:  °Unisom (doxylamine succinate 25 mg tablets) Take one tablet daily at bedtime. If symptoms are not adequately controlled, the dose can be increased to a maximum recommended dose of two tablets daily (1/2 tablet in the morning, 1/2 tablet mid-afternoon and one at bedtime). °Vitamin B6 100mg tablets. Take one tablet twice a day (up to 200 mg per day). ° °Skin Rashes: °Aveeno products °Benadryl cream or 25mg every 6 hours as needed °Calamine Lotion °1% cortisone cream ° °Yeast infection: °Gyne-lotrimin 7 °Monistat 7 ° ° °**If taking multiple medications, please check labels to avoid duplicating the same active ingredients °**take medication as directed on  the label °** Do not exceed 4000 mg of tylenol in 24 hours °**Do not take medications that contain aspirin or ibuprofen ° ° ° ° ° °Abdominal Pain During Pregnancy °Abdominal pain is common in pregnancy. Most of the time, it does not cause harm. There are many causes of abdominal pain. Some causes are more serious than others and sometimes the cause is not known. Abdominal pain can be a sign that something is very wrong with the pregnancy or the pain may have nothing to do with the pregnancy. Always tell your health care provider if you have any abdominal pain. °Follow these instructions at home: °· Do not have sex or put anything in your vagina until your symptoms go away completely. °· Watch your abdominal pain for any changes. °· Get plenty of rest until your pain improves. °· Drink enough fluid to keep your urine clear or pale yellow. °· Take over-the-counter or prescription medicines only as told by your health care provider. °· Keep all follow-up visits as told by your health care provider. This is important. °Contact a health care provider if: °· You have a fever. °· Your pain gets worse or you have cramping. °· Your pain continues after resting. °Get help right away if: °· You are bleeding, leaking fluid, or passing tissue from the vagina. °· You have vomiting or diarrhea that does not go away. °· You have painful or bloody urination. °· You notice a decrease in your baby's movements. °· You feel very weak or faint. °· You have shortness of breath. °· You develop a severe headache with abdominal pain. °· You have abnormal vaginal discharge with abdominal pain. °This information is not intended to replace advice given   to you by your health care provider. Make sure you discuss any questions you have with your health care provider. °Document Released: 05/01/2005 Document Revised: 02/10/2016 Document Reviewed: 11/28/2012 °Elsevier Interactive Patient Education © 2018 [employer] ° °

## 2017-06-21 NOTE — MAU Note (Signed)
Pt reports she has positive pregnancy test. got confirmation letter yesterday from urgent care. Went to [facility] today and passed a large clot and is having abd pain and cramping

## 2017-06-22 LAB — HIV ANTIBODY (ROUTINE TESTING W REFLEX): HIV SCREEN 4TH GENERATION: NONREACTIVE

## 2017-06-22 LAB — GC/CHLAMYDIA PROBE AMP (~~LOC~~) NOT AT ARMC
Chlamydia: NEGATIVE
NEISSERIA GONORRHEA: NEGATIVE

## 2017-06-26 ENCOUNTER — Inpatient Hospital Stay (HOSPITAL_COMMUNITY)
Admission: AD | Admit: 2017-06-26 | Discharge: 2017-06-26 | Disposition: A | Discharge status: Home / Self Care | Payer: Medicaid Other | Source: Ambulatory Visit | Attending: Obstetrics and Gynecology | Admitting: Obstetrics and Gynecology

## 2017-06-26 DIAGNOSIS — O039 Complete or unspecified spontaneous abortion without complication: Secondary | ICD-10-CM | POA: Insufficient documentation

## 2017-06-26 LAB — CBC
HCT: 27.1 % — ABNORMAL LOW (ref 36.0–46.0)
HEMOGLOBIN: 9.1 g/dL — AB (ref 12.0–15.0)
MCH: 28.7 pg (ref 26.0–34.0)
MCHC: 33.6 g/dL (ref 30.0–36.0)
MCV: 85.5 fL (ref 78.0–100.0)
PLATELETS: 552 10*3/uL — AB (ref 150–400)
RBC: 3.17 MIL/uL — ABNORMAL LOW (ref 3.87–5.11)
RDW: 16.6 % — AB (ref 11.5–15.5)
WBC: 12.4 10*3/uL — ABNORMAL HIGH (ref 4.0–10.5)

## 2017-06-26 LAB — HCG, QUANTITATIVE, PREGNANCY: HCG, BETA CHAIN, QUANT, S: 13 m[IU]/mL — AB (ref <5)

## 2017-06-26 NOTE — Discharge Instructions (Signed)

## 2017-06-26 NOTE — MAU Provider Note (Signed)
[name]  is a 31 y.o. [id number]  at [redacted]w[redacted]d who presents to MAU today for follow-up quant hCG. The patient denies abdominal pain, vaginal bleeding, N/V or fever. She was seen in MAU on Thursday for vaginal bleeding. Was supposed to return to MAU on Saturday for repeat BHCG but was unable to come in. Is here today for labs only. Denies any complaints.   BP 102/65 (BP Location: Right Arm)   Pulse (!) 102   Temp 97.8 F (36.6 C) (Oral)   Resp 15   Ht 5\' 1"  (1.549 m)   Wt 140 lb (63.5 kg)   LMP 04/30/2017   SpO2 100%   BMI 26.45 kg/m   GENERAL: Well-developed, well-nourished female in no acute distress.  HEENT: Normocephalic, atraumatic.   LUNGS: Effort normal HEART: Regular rate  SKIN: Warm, dry and without erythema PSYCH: Normal mood and affect  Component     Latest Ref Rng & Units 06/21/2017 06/26/2017  HCG, Beta Chain, Quant, S     <5 mIU/mL 65 (H) 13 (H)  Significant drop in HCG  A: 1. Miscarriage     P: Discharge home Miscarriage f/u in 2 weeks -- msg sent to [place] Discussed reasons to return to MAU Pelvic rest until f/u  Jorje Guild, NP  06/26/2017 1:50 PM

## 2017-06-26 NOTE — MAU Note (Signed)
Pt reports she was scheduled to return last week for follow up but was unable to come. Denies pain or bleeding.

## 2017-07-05 ENCOUNTER — Other Ambulatory Visit: Payer: Self-pay | Admitting: Obstetrics & Gynecology

## 2017-07-05 DIAGNOSIS — E041 Nontoxic single thyroid nodule: Secondary | ICD-10-CM

## 2017-07-11 ENCOUNTER — Encounter: Payer: Medicaid Other | Admitting: Family Medicine

## 2017-07-11 ENCOUNTER — Ambulatory Visit
Admission: RE | Admit: 2017-07-11 | Discharge: 2017-07-11 | Disposition: A | Discharge status: Home / Self Care | Payer: Medicaid Other | Source: Ambulatory Visit | Attending: Obstetrics & Gynecology | Admitting: Obstetrics & Gynecology

## 2017-07-11 DIAGNOSIS — E041 Nontoxic single thyroid nodule: Secondary | ICD-10-CM

## 2017-07-31 ENCOUNTER — Encounter (HOSPITAL_COMMUNITY): Payer: Self-pay | Admitting: Emergency Medicine

## 2017-07-31 ENCOUNTER — Other Ambulatory Visit: Payer: Self-pay

## 2017-07-31 ENCOUNTER — Ambulatory Visit (HOSPITAL_COMMUNITY)
Admission: EM | Admit: 2017-07-31 | Discharge: 2017-07-31 | Disposition: A | Discharge status: Home / Self Care | Payer: Medicaid Other | Attending: Family Medicine | Admitting: Family Medicine

## 2017-07-31 DIAGNOSIS — E042 Nontoxic multinodular goiter: Secondary | ICD-10-CM

## 2017-07-31 MED ORDER — MELOXICAM 7.5 MG PO TABS: 7.5000 mg | ORAL_TABLET | Freq: Every day | ORAL | 12 refills | Status: DC | PRN

## 2017-07-31 NOTE — ED Triage Notes (Signed)
Neck pain started 10/18.  Has seen many providers for neck pain.  Patient here today due to continued pain.

## 2017-07-31 NOTE — ED Provider Notes (Signed)
[place]   [id number] 07/31/17 Arrival Time: 1507   SUBJECTIVE:  Joy Ferguson is a 32 y.o. female who presents to the urgent care with complaint of left neck pain with h/o thyroid nodule and normal thyroid function tests.    The thyroid nodule is bothersome, although it is decreasing in size.     Past Medical History:  Diagnosis Date  . Anemia   . GERD (gastroesophageal reflux disease)   . Sickle cell anemia (HCC)    Family History  Problem Relation Age of Onset  . Hypertension Father   . Diabetes Paternal Grandmother   . Hypertension Paternal Grandmother    Social History   Socioeconomic History  . Marital status: Married    Spouse name: Not on file  . Number of children: Not on file  . Years of education: Not on file  . Highest education level: Not on file  Social Needs  . Financial resource strain: Not on file  . Food insecurity - worry: Not on file  . Food insecurity - inability: Not on file  . Transportation needs - medical: Not on file  . Transportation needs - non-medical: Not on file  Occupational History  . Not on file  Tobacco Use  . Smoking status: Never Smoker  Substance and Sexual Activity  . Alcohol use: No  . Drug use: No  . Sexual activity: Yes  Other Topics Concern  . Not on file  Social History Narrative  . Not on file   Current Meds  Medication Sig  . Prenatal Vit-Fe Fumarate-FA (PRENATAL MULTIVITAMIN) TABS tablet Take 1 tablet by mouth daily at 12 noon.   No Known Allergies    ROS: As per HPI, remainder of ROS negative.   OBJECTIVE:   Vitals:   07/31/17 1539  BP: 112/61  Pulse: 88  Resp: 18  Temp: (!) 97.2 F (36.2 C)  TempSrc: Oral  SpO2: 100%     General appearance: alert; no distress Eyes: PERRL; EOMI; conjunctiva normal HENT: normocephalic; atraumatic;, external ears normal without trauma; Neck: supple; 2-3 mm left thyroid nodule Extremities: no cyanosis or edema; symmetrical with no gross  deformities Skin: warm and dry Neurologic: normal gait; grossly normal Psychological: alert and cooperative; normal mood and affect      Labs:  Results for orders placed or performed during the hospital encounter of 06/26/17  CBC  Result Value Ref Range   WBC 12.4 (H) 4.0 - 10.5 K/uL   RBC 3.17 (L) 3.87 - 5.11 MIL/uL   Hemoglobin 9.1 (L) 12.0 - 15.0 g/dL   HCT 27.1 (L) 36.0 - 46.0 %   MCV 85.5 78.0 - 100.0 fL   MCH 28.7 26.0 - 34.0 pg   MCHC 33.6 30.0 - 36.0 g/dL   RDW 16.6 (H) 11.5 - 15.5 %   Platelets 552 (H) 150 - 400 K/uL  hCG, quantitative, pregnancy  Result Value Ref Range   hCG, Beta Chain, Quant, S 13 (H) <5 mIU/mL    CLINICAL DATA:  Palpable abnormality.  Left neck suspected nodules.  EXAM: THYROID ULTRASOUND  TECHNIQUE: Ultrasound examination of the thyroid gland and adjacent soft tissues was performed.  COMPARISON:  None.  FINDINGS: Parenchymal Echotexture: Mildly heterogenous  Isthmus: 0.7 cm  Right lobe: 5.6 x 1.5 x 1.5 cm  Left lobe: 4.7 x 1.7 x 1.7 cm  _________________________________________________________  Estimated total number of nodules >/= 1 cm: 3  Number of spongiform nodules >/=  2 cm not described below (TR1):  0  Number of mixed cystic and solid nodules >/= 1.5 cm not described below ([place]): 0  _________________________________________________________  Nodule # 2:  Location: Right; Mid  Maximum size: 1.0 cm; Other 2 dimensions: 0.9 x 0.6 cm  Composition: solid/almost completely solid (2)  Echogenicity: very hypoechoic (3)  Shape: not taller-than-wide (0)  Margins: smooth (0)  Echogenic foci: none (0)  ACR TI-RADS total points: 5.  ACR TI-RADS risk category: TR4 (4-6 points).  ACR TI-RADS recommendations:  *Given size (>/= 1 - 1.4 cm) and appearance, a follow-up ultrasound in 1 year should be considered based on TI-RADS  criteria.  _________________________________________________________  Nodule # 3:  Location: Left; Mid  Maximum size: 1.6 cm; Other 2 dimensions: 1.2 x 1.2 cm  Composition: mixed cystic and solid (1)  Echogenicity: hypoechoic (2)  Shape: not taller-than-wide (0)  Margins: smooth (0)  Echogenic foci: none (0)  ACR TI-RADS total points: 3.  ACR TI-RADS risk category: TR3 (3 points).  ACR TI-RADS recommendations:  *Given size (>/= 1.5 - 2.4 cm) and appearance, a follow-up ultrasound in 1 year should be considered based on TI-RADS criteria.  _________________________________________________________  Nodule # 4:  Location: Left; Inferior  Maximum size: 1.2 cm; Other 2 dimensions: 0.9 x 0.8 cm  Composition: solid/almost completely solid (2)  Echogenicity: hypoechoic (2)  Shape: not taller-than-wide (0)  Margins: smooth (0)  Echogenic foci: none (0)  ACR TI-RADS total points: 4.  ACR TI-RADS risk category: TR4 (4-6 points).  ACR TI-RADS recommendations:  *Given size (>/= 1 - 1.4 cm) and appearance, a follow-up ultrasound in 1 year should be considered based on TI-RADS criteria.  _________________________________________________________  Small nodule 1 in the isthmus does not meet criteria for biopsy nor follow-up.  IMPRESSION: Nodules 2, 3, and 4 all meet criteria for annual follow-up.  The above is in keeping with the ACR TI-RADS recommendations - J Am Coll Radiol 2017;14:587-595.   Electronically Signed   By: Marybelle Killings M.D.   On: 07/12/2017 08:02    ASSESSMENT & PLAN:  1. Multiple thyroid nodules     Meds ordered this encounter  Medications  . meloxicam (MOBIC) 7.5 MG tablet    Sig: Take 1 tablet (7.5 mg total) by mouth daily as needed for pain.    Dispense:  30 tablet    Refill:  12    Reviewed expectations re: course of current medical issues. Questions answered. Outlined signs and symptoms  indicating need for more acute intervention. Patient verbalized understanding. After Visit Summary given.    Procedures:      Robyn Haber, MD 07/31/17 1600

## 2017-07-31 NOTE — Discharge Instructions (Signed)
CLINICAL DATA:  Palpable abnormality.  Left neck suspected nodules.   EXAM: THYROID ULTRASOUND   TECHNIQUE: Ultrasound examination of the thyroid gland and adjacent soft tissues was performed.   COMPARISON:  None.   FINDINGS: Parenchymal Echotexture: Mildly heterogenous   Isthmus: 0.7 cm   Right lobe: 5.6 x 1.5 x 1.5 cm   Left lobe: 4.7 x 1.7 x 1.7 cm   _________________________________________________________   Estimated total number of nodules >/= 1 cm: 3   Number of spongiform nodules >/=  2 cm not described below (TR1): 0   Number of mixed cystic and solid nodules >/= 1.5 cm not described below (TR2): 0   _________________________________________________________   Nodule # 2:   Location: Right; Mid   Maximum size: 1.0 cm; Other 2 dimensions: 0.9 x 0.6 cm   Composition: solid/almost completely solid (2)   Echogenicity: very hypoechoic (3)   Shape: not taller-than-wide (0)   Margins: smooth (0)   Echogenic foci: none (0)   ACR TI-RADS total points: 5.   ACR TI-RADS risk category: TR4 (4-6 points).   ACR TI-RADS recommendations:   *Given size (>/= 1 - 1.4 cm) and appearance, a follow-up ultrasound in 1 year should be considered based on TI-RADS criteria.   _________________________________________________________   Nodule # 3:   Location: Left; Mid   Maximum size: 1.6 cm; Other 2 dimensions: 1.2 x 1.2 cm   Composition: mixed cystic and solid (1)   Echogenicity: hypoechoic (2)   Shape: not taller-than-wide (0)   Margins: smooth (0)   Echogenic foci: none (0)   ACR TI-RADS total points: 3.   ACR TI-RADS risk category: TR3 (3 points).   ACR TI-RADS recommendations:   *Given size (>/= 1.5 - 2.4 cm) and appearance, a follow-up ultrasound in 1 year should be considered based on TI-RADS criteria.   _________________________________________________________   Nodule # 4:   Location: Left; Inferior   Maximum size: 1.2 cm; Other 2  dimensions: 0.9 x 0.8 cm   Composition: solid/almost completely solid (2)   Echogenicity: hypoechoic (2)   Shape: not taller-than-wide (0)   Margins: smooth (0)   Echogenic foci: none (0)   ACR TI-RADS total points: 4.   ACR TI-RADS risk category: TR4 (4-6 points).   ACR TI-RADS recommendations:   *Given size (>/= 1 - 1.4 cm) and appearance, a follow-up ultrasound in 1 year should be considered based on TI-RADS criteria.   _________________________________________________________   Small nodule 1 in the isthmus does not meet criteria for biopsy nor follow-up.   IMPRESSION: Nodules 2, 3, and 4 all meet criteria for annual follow-up.   The above is in keeping with the ACR TI-RADS recommendations - J Am Coll Radiol 2017;14:587-595.     Electronically Signed   By: Marybelle Killings M.D.   On: 07/12/2017 08:02

## 2017-10-01 ENCOUNTER — Encounter (HOSPITAL_COMMUNITY): Payer: Self-pay

## 2017-10-01 LAB — OB RESULTS CONSOLE RPR: RPR: NONREACTIVE

## 2017-10-01 LAB — OB RESULTS CONSOLE ABO/RH: RH TYPE: POSITIVE

## 2017-10-01 LAB — OB RESULTS CONSOLE HEPATITIS B SURFACE ANTIGEN: HEP B S AG: NEGATIVE

## 2017-10-01 LAB — OB RESULTS CONSOLE ANTIBODY SCREEN: Antibody Screen: NEGATIVE

## 2017-10-01 LAB — OB RESULTS CONSOLE HIV ANTIBODY (ROUTINE TESTING): HIV: NONREACTIVE

## 2017-10-01 LAB — OB RESULTS CONSOLE GC/CHLAMYDIA
Chlamydia: NEGATIVE
Gonorrhea: NEGATIVE

## 2017-10-01 LAB — OB RESULTS CONSOLE RUBELLA ANTIBODY, IGM: RUBELLA: IMMUNE

## 2017-10-04 ENCOUNTER — Encounter (HOSPITAL_COMMUNITY): Payer: Self-pay

## 2017-10-05 ENCOUNTER — Ambulatory Visit (HOSPITAL_COMMUNITY)
Admission: RE | Admit: 2017-10-05 | Discharge: 2017-10-05 | Disposition: A | Discharge status: Home / Self Care | Payer: Medicaid Other | Source: Ambulatory Visit | Attending: Obstetrics and Gynecology | Admitting: Obstetrics and Gynecology

## 2017-10-11 ENCOUNTER — Encounter (HOSPITAL_COMMUNITY): Payer: Self-pay | Admitting: MS"

## 2017-10-11 ENCOUNTER — Ambulatory Visit (HOSPITAL_COMMUNITY)
Admission: RE | Admit: 2017-10-11 | Discharge: 2017-10-11 | Disposition: A | Discharge status: Home / Self Care | Payer: Medicaid Other | Source: Ambulatory Visit | Attending: Obstetrics and Gynecology | Admitting: Obstetrics and Gynecology

## 2017-10-11 ENCOUNTER — Ambulatory Visit (HOSPITAL_COMMUNITY)
Admission: RE | Admit: 2017-10-11 | Discharge: 2017-10-11 | Disposition: A | Discharge status: Home / Self Care | Payer: Medicaid Other | Source: Ambulatory Visit

## 2017-10-11 DIAGNOSIS — Z315 Encounter for genetic counseling: Secondary | ICD-10-CM | POA: Insufficient documentation

## 2017-10-11 DIAGNOSIS — Z843 Family history of consanguinity: Secondary | ICD-10-CM

## 2017-10-11 DIAGNOSIS — Z3A12 12 weeks gestation of pregnancy: Secondary | ICD-10-CM | POA: Insufficient documentation

## 2017-10-11 DIAGNOSIS — O99011 Anemia complicating pregnancy, first trimester: Secondary | ICD-10-CM | POA: Diagnosis not present

## 2017-10-11 DIAGNOSIS — D571 Sickle-cell disease without crisis: Secondary | ICD-10-CM | POA: Insufficient documentation

## 2017-10-11 DIAGNOSIS — D5700 Hb-SS disease with crisis, unspecified: Secondary | ICD-10-CM

## 2017-10-11 NOTE — Progress Notes (Signed)
Genetic Counseling  High-Risk Gestation Note  Appointment Date:  10/11/2017 Referred By: Joy Graff, MD Date of Birth:  19-May-1985 Partner:  Joy Ferguson   Pregnancy History: [id number] Estimated Date of Delivery: 04/25/18 Estimated Gestational Age: 19w0dAttending: MRenella Cunas MD  I met with [name] for genetic counseling because of her personal history of sickle cell disease (Hb SS disease). UKilmichaelinterpreter was present for today's visit.   In summary:  Discussed patient's diagnosis of Hb SS disease  [facility]  Patient stated that her OB has referred her for follow-up with hematology/sickle cell clinic- not yet seen during pregnancy  MFM consultation is available to discussed pregnancy management- please contact our office to schedule, if desired  Reviewed autosomal recessive inheritance of sickle cell disease and other beta globin chain hemoglobinopathies  All patient's offspring would be obligate carriers for Hb S trait (sickle cell trait)  Risk to be affected with hemoglobinopathy depends upon carrier status of patient's partner  Father of the pregnancy has not had screening for sickle cell trait; his status is unknown  Discussed family history of consanguinity  Patient and husband are maternal first cousins to each other  Chance for sickle cell trait in father of pregnancy is approximately 1 in 4 prior to screening, given reported family history  Risk for sickle cell disease in current pregnancy is approximately 1 in 8, prior to carrier screening for father of pregnancy  Reviewed option of carrier screening for father of pregnancy by hemoglobin electrophoresis and complete blood count or molecular HBB testing  Discussed options of screening / testing  [facility] or amniocentesis- if pregnancy identified to be at risk for sickle cell disease after testing both parents-  patient declined prenatal invasive testing  Newborn screening- postnatal assessment for hemoglobin S in the newborn period  Discussed general population carrier screening options and expanded pan-ethnic carrier screening  Discussed increased chance for autosomal recessive conditions in offspring of first cousins  Joy Ferguson elected to pursue [facility]ight expanded carrier screen ([facility]) today- assesses for carrier status of 175 conditions   We began by reviewing the family history in detail.  Ms. AAnahi Belmarreported that she has sickle cell disease, which was diagnosed during her previous pregnancy in 2016, with hemoglobin electrophoresis demonstrating Hb SS. She reported that she has always had anemia but the cause was not known to her prior to that time. She described having blood transfusions in the past for treatment and she reported that she has occasional symptoms of dizziness and lower leg pain. [facility] She reported that she was last seen in 2016 and does not receive regular follow-up. Her OB has referred her to the sickle cell clinic to reestablish care. Ms. ADenys Labreereported several paternal uncles and cousins who also have sickle cell anemia. The patient reported that she and her husband are maternal first cousins; their mothers are sisters. The patient's husband does not have symptoms of anemia, and he has not yet had testing regarding his sickle cell status. The couple's previous son is currently two and a [age] years old and does not have sickle cell anemia. No additional relatives were reported with symptoms of diagnosis of sickle cell anemia. [place]  The family histories were otherwise found to be noncontributory for birth defects, intellectual disability, and known genetic conditions.  We spent time reviewing genes, chromosomes, and the autosomal recessive inheritance of  hemoglobinopathies. We discussed that sickle cell disease affects the shape and function of the red blood cell by producing abnormal hemoglobin. She was counseled that hemoglobin is a protein in the RBCs that carries oxygen to the body's organs. Hemoglobin is comprised of alpha subunits and beta subunits. We discussed that sickle cell disease is caused by a change in the beta globin subunits. Individuals who have beta chain hemoglobinopathies have a change within the HBB gene that codes for the beta subunit of hemoglobin. Joy Ferguson was counseled that Hb SS disease (sickle cell anemia) is inherited in an autosomal recessive manner, and occurs when both copies of the beta hemoglobin gene are changed and produce an abnormal hemoglobin. Thus, Joy Ferguson would be assumed to have inherited one copy of hemoglobin S from each of her parents, meaning her parents are obligate carriers of sickle cell trait (Hb S trait).  A carrier of sickle cell trait has one altered copy of the gene for hemoglobin and one typical working copy. Carriers of recessive conditions typically do not have symptoms related to the condition because they still have one functioning copy of the gene, and thus some production of the typical protein coded for by that gene.   We reviewed the importance of continued and regular evaluation with hematology/sickle cell clinic for the patient.  The patient's OB notes indicate that a referral was recently placed for the patient to hematology/sickle cell clinic to reestablish care. Given the associations with maternal sickle cell disease and pregnancy, maternal fetal medicine consultation is also available to the patient, if desired, to further discuss pregnancy management. Please contact our office to schedule MFM consult, if desired.    Given the autosomal recessive inheritance, we discussed that all of Ms. ,Ferguson offspring are expected to be obligate carriers for Hb S (sickle cell) trait. Given that her  son is reportedly unaffected, we discussed that he would an obligate carrier for sickle cell trait. The risk for a hemoglobinopathy in offspring depends upon the hemoglobin carrier status of the father of the pregnancy. The patient understands that if the father of the pregnancy is not a carrier of a beta globin chain pathogenic variant, then the pregnancy would not be at risk to have sickle cell disease, but all offspring together would have sickle cell trait (Hb S trait). If he were to be a carrier, then the risk would be 1 in 2 (50%) for offspring to be affected with a sickle cell disease and 1 in 2 (50%) chance for sickle cell trait. Joy Ferguson reported that her husband has not had screening to assess his sickle cell status. Given their degree of relation to each other, 3rd degree relatives, he would have an approximate 1 in 4 (25%) chance to carry sickle cell trait (Hb S trait). Thus, prior to screening for the father of the pregnancy, the risk for sickle cell disease in the current pregnancy is approximately 1 in 8 (12%). Carrier screening for the father of the pregnancy would further refine this risk assessment.   We discussed that the father of the pregnancy has the option of carrier testing by hemoglobin electrophoresis of molecular HBB analysis to determine whether he has any hemoglobin variant, including sickle cell trait. He was not present at today's appointment. Joy Ferguson may contact us should they desire [name] to facilitate screening for him. If both parents are identified to have pathogenic HBB variants and  a pregnancy is at risk for a hemoglobinopathy, prenatal molecular diagnosis would be available by amniocentesis. We reviewed risks, benefits, and limitations of amniocentesis, including the associated 1 in 443-154 risk for complications. Joy Ferguson stated she would not be interested in amniocentesis in pregnancy, even if the father of the pregnancy were identified to carry a beta chain hemoglobin  variant. We also reviewed the availability of newborn screening in [place] for hemoglobinopathies. We discussed that prenatal ultrasound is not able to assess for features of sickle cell disease or other beta chain hemoglobinopathies. Further genetic counseling is warranted if more information is obtained.  We also discussed the family history of consanguinity. We discussed that children born to a consanguineous couple are at increased risk for genetic conditions.  This increase in risk is related to the possibility of both parents passing on a recessive gene. We explained that every person carries approximately 7-10 non-working genes that when received in a double dose results in an autosomal recessive genetic condition.  In general, unrelated couples have a relatively low risk of having a child with a recessive condition because the likelihood of both parents carrying the same non-working recessive gene is very low.  However, when a couple is related, they have inherited some of their genetic information from the same family member, which leads to an increased chance that they may carry the same recessive gene and have a child with a recessive condition.  For first cousin unions, the risk to have a child with a birth defect, mental retardation, or genetic condition is increased approximately 2-4% above the general population risk (3-5%).    There are a myriad of genetic disorders that occur more frequently in specific ethnic groups, those which can be traced to particular geographic locations. We discussed that although these genetic disorders are much more prevalent in specific ethnic groups, as families are becoming increasingly multiracial and multicultural, these conditions can occur in anyone from any race or ethnicity. For this reason, we discussed the availability of ethnic specific genetic carrier screening, professional society (ACOG) recommended carrier testing, and pan-ethnic carrier  screening.   We reviewed that ACOG currently recommends that all patients be offered carrier screening for cystic fibrosis, spinal muscular atrophy and hemoglobinopathies. In addition, they were counseled that there are a variety of genetic screening laboratories that have pan-ethnic, or expanded, carrier screening panels, which evaluate carrier status for a wide range of genetic conditions. Some of these conditions are severe and actionable, but also rare; others occur more commonly, but are less severe. We discussed that testing options range from screening for a single condition to panels of more than 200 autosomal or X-linked genetic conditions. We reviewed that the prevalence of each condition varies (and often varies with ethnicity). Thus the couples' background risk to be a carrier for each of these various conditions would range, and in some cases be very low or unknown. Similarly, the detection rate varies with each condition and also varies in some cases with ethnicity, ranging from greater than 99% (in the case of hemoglobinopathies) to unknown. We reviewed that a negative carrier screen would thus reduce, but not eliminate the chance to be a carrier for these conditions. For some conditions included on specific pan-ethnic carrier screening panels, the pre-test carrier frequency and/or the detection rate is unknown. Thus, for some conditions, the exact reduction of risk with a negative carrier screening result may not be able to be quantified. We reviewed that in the event that  one partner is found to be a carrier for one or more conditions, carrier screening would be available to the partner for those conditions. After thoughtful consideration of their options, Joy Ferguson elected to have the expanded carrier screening panel through [facility]. This panel ([facility]) screens for carrier status of 175 hereditary disorders, including the ACOG recommended conditions of CF and SMA. We discussed  that results will be available in approximately 2 weeks.   Joy Ferguson denied exposure to environmental toxins or chemical agents. She denied the use of alcohol, tobacco or street drugs.   I counseled Joy Ferguson regarding the above risks and available options.  The approximate face-to-face time with the genetic counselor was 45 minutes.  Chipper Oman, MS Certified Genetic Counselor 10/12/2017 9:53 AM

## 2017-10-12 ENCOUNTER — Other Ambulatory Visit: Payer: Self-pay

## 2017-10-12 DIAGNOSIS — Z843 Family history of consanguinity: Secondary | ICD-10-CM | POA: Insufficient documentation

## 2017-10-12 DIAGNOSIS — Z3A12 12 weeks gestation of pregnancy: Secondary | ICD-10-CM | POA: Insufficient documentation

## 2017-10-16 DIAGNOSIS — Z3490 Encounter for supervision of normal pregnancy, unspecified, unspecified trimester: Secondary | ICD-10-CM | POA: Insufficient documentation

## 2017-10-16 DIAGNOSIS — Z8751 Personal history of pre-term labor: Secondary | ICD-10-CM | POA: Insufficient documentation

## 2017-10-17 DIAGNOSIS — E041 Nontoxic single thyroid nodule: Secondary | ICD-10-CM | POA: Insufficient documentation

## 2017-10-19 ENCOUNTER — Other Ambulatory Visit: Payer: Self-pay

## 2017-10-25 ENCOUNTER — Encounter (HOSPITAL_COMMUNITY): Payer: Self-pay

## 2017-10-25 ENCOUNTER — Telehealth (HOSPITAL_COMMUNITY): Payer: Self-pay | Admitting: MS"

## 2017-10-25 NOTE — Telephone Encounter (Signed)
Attempted to contact patient regarding results of expanded carrier screening ([facility]). Left message for patient to return call.   Joy Ferguson 10/25/2017 4:31 PM

## 2017-10-26 ENCOUNTER — Encounter (HOSPITAL_COMMUNITY): Payer: Self-pay

## 2017-10-26 ENCOUNTER — Ambulatory Visit (HOSPITAL_COMMUNITY)
Admission: RE | Admit: 2017-10-26 | Discharge: 2017-10-26 | Disposition: A | Discharge status: Home / Self Care | Payer: Medicaid Other | Source: Ambulatory Visit | Attending: Obstetrics and Gynecology | Admitting: Obstetrics and Gynecology

## 2017-10-26 ENCOUNTER — Telehealth (HOSPITAL_COMMUNITY): Payer: Self-pay | Admitting: MS"

## 2017-10-26 ENCOUNTER — Other Ambulatory Visit: Payer: Self-pay

## 2017-10-26 DIAGNOSIS — O99012 Anemia complicating pregnancy, second trimester: Secondary | ICD-10-CM | POA: Insufficient documentation

## 2017-10-26 DIAGNOSIS — D563 Thalassemia minor: Secondary | ICD-10-CM | POA: Diagnosis not present

## 2017-10-26 DIAGNOSIS — Z3A14 14 weeks gestation of pregnancy: Secondary | ICD-10-CM | POA: Diagnosis not present

## 2017-10-26 DIAGNOSIS — Z843 Family history of consanguinity: Secondary | ICD-10-CM

## 2017-10-26 DIAGNOSIS — D5780 Other sickle-cell disorders without crisis: Secondary | ICD-10-CM | POA: Insufficient documentation

## 2017-10-26 HISTORY — DX: Essential (hemorrhagic) thrombocythemia: D47.3

## 2017-10-26 HISTORY — DX: Thrombocytosis, unspecified: D75.839

## 2017-10-26 NOTE — Telephone Encounter (Signed)
Patient's husband returned call. Identified patient by name and DOB. Joy Ferguson had expanded carrier screening through [employer] The patient was identified by name and DOB. We reviewed that the results are negative for all of the conditions for which analysis was performed, with the exception of sickle cell anemia, the couple was already aware, and alpha thalassemia.  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    The patient's husband reported that he was previously tested and was negative for sickle cell trait. We discussed that given this report, their offspring would have sickle cell trait but not be at increased risk for sickle cell disease, though this depends upon the type of screening performed for him and whether or not other hemoglobin variants were also assessed.    Alpha thalassemia is different in its inheritance compared to other hemoglobinopathies as there are two alpha globin genes on each chromosome 16 (aa/aa).  A person can be a carrier of one alpha gene mutation (aa/a-), also referred to as a "silent carrier" or of more than one mutation.  A person who carriers two alpha globin gene mutations can either carry them in cis (both on the same chromosome, denoted as aa/--) or in trans (on different chromosomes, denoted as a-/a-) .  We discussed that alpha thalassemia carriers of two mutations who have African ancestry are more likely to have a trans arrangements (a-/a-), and individuals with Asian ancestry who are alpha thalassemia carriers usually have a cis arrangement (aa/--) of their alpha globin gene mutations.  Alpha-thalassemia carriers with cis configuration is reported to be rare in individuals with African ancestry. We discussed that given the reported family history, the likelihood for the father of the pregnancy.   Carrier screening is available to the  father of the pregnancy if desired. He is not interested in carrier screening at this time.   All questions were answered to his satisfaction, he was encouraged to call with additional questions or concerns. ? Chipper Oman, MS Insurance risk surveyor

## 2017-10-26 NOTE — Consult Note (Signed)
MFM consult  Joy Ferguson is a 32 yr old [id number] at [redacted]w[redacted]d with sickle cell disease and silent carrier for alpha thalassemia with a history of preterm delivery referred by Dr. Mancel Bale for consult.  Past OB hx: 2016 SVD at [redacted]w[redacted]d- preterm labor; 2 days after discharge patient developed pneumonia and acute chest syndrome- was transfused 2 units of PRBCs  PMH: sickle cell disease; silent carrier alpha thalassemia  Medications: folic acid, PNV  I counseled the patient as follows: 1. Sickle cell disease: I discussed increased risks in pregnancy include miscarriage, fetal growth restriction, preterm delivery, and fetal demise. There is an increased risk of preeclampsia. There is also an increased mortality rate in women with sickle cell disease.  Risk of fetal growth restriction is 20-40%- recommend serial fetal growth ultrasounds starting at 24 weeks gesation. Risk of preterm delivery is 35%. Risk of miscarriage, stillbirth, or neonatal death is approximately 30%. Sickle cell disease is hereditary- patient has been previously counseled by genetic counselor. She has had Counsyl done- see separate report. Patient is also a silent carrier for alpha thalassemia. The patient's husband reports he is negative for sickle cell and declined testing for thalassemia.  Maternal complications include: increased risk of sickle cell crisis, venous thromboembolism, pyelonephritis, preeclampsia, preterm labor, PPROM, C section, and sepsis.  Patient has a history of blood transfusions- her antibody screen is negative.  Recommend follow with serial growth ultrasounds. Recommend start fetal kick counts at [redacted] weeks gestation, start antenatal testing at [redacted] weeks gestation, and close surveillance for the development of signs/symptoms or preterm labor, PPROM, and preeclampsia.  Recommend monitor hemoglobin closely. Recommend transfuse as needed to keep hemoglobin at least >7, and likely this patient would benefit from  keeping her hgb >8 as the only time she had an issue was when her hgb was <8 after delivery of her first child.  The patient has not seen Hematology in 3 years- she has an appointment [facility] with [facility] on [facility]/29.  Check iron studies- when ferritin was checked in 2016 her ferritin was increased as is common with patients with sickle cell disease. Recommend she take prenatal vitamins without iron. Recommend folic acid supplementation of 4mg /day. Recommend check urine culture q trimester. Recommend insure patient has vaccines as appropriate: pneumovax, influenza  Recommend delivery at 43 weeks or sooner if clinically indicated given increased risk of morbidity and mortality to mother and baby.  Sickle cell crises should be treated the same as in nonpregnant persons- IV fluids, oxygen, pain control, antibiotics if indicated, and transfusion if indicated.  Discussed patient should remain well hydrated and avoid scenarios that could precipitate a sickle cell crisis- dehydration, overexertion, etc  2. History of preterm labor: I discussed that with a history of preterm delivery the risk is increased in future pregnancies. Studies have shown that the frequency of recurrent preterm birth was 74 to 22 % after one preterm delivery, 28 to 42 % after two preterm deliveries, and 67% after three preterm deliveries. Term births decreased the risk of preterm birth in subsequent pregnancies.   I discussed the recommendation of weekly injections of 17 OH progesterone as use from 16-36 weeks has been shown to decrease the risk of recurrent preterm birth by up to 40% in women with a prior spontaneous preterm delivery. I discussed we do not know of any adverse effects of progesterone however long term data is limited. Patient is in agree ment and would like to start progesterone injections at 16 weeks.  Recommend transvaginal  cervical length surveillance at least every 2 weeks from 16-24 weeks. Recommend preterm labor  precautions.  3. Recommend offer aneuploidy screening. 4. Recommend fetal anatomic survey at 18-20 weeks.  I spent a total of 60 minutes with the patient of which >50% was in face to face consultation.  Please call with questions.  [place], MD

## 2017-12-05 DIAGNOSIS — Z8759 Personal history of other complications of pregnancy, childbirth and the puerperium: Secondary | ICD-10-CM | POA: Insufficient documentation

## 2017-12-05 DIAGNOSIS — N9081 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, unspecified: Secondary | ICD-10-CM | POA: Insufficient documentation

## 2017-12-05 DIAGNOSIS — D75839 Thrombocytosis, unspecified: Secondary | ICD-10-CM | POA: Insufficient documentation

## 2017-12-05 DIAGNOSIS — F809 Developmental disorder of speech and language, unspecified: Secondary | ICD-10-CM | POA: Insufficient documentation

## 2017-12-06 DIAGNOSIS — D563 Thalassemia minor: Secondary | ICD-10-CM | POA: Insufficient documentation

## 2018-03-13 DIAGNOSIS — O409XX0 Polyhydramnios, unspecified trimester, not applicable or unspecified: Secondary | ICD-10-CM | POA: Insufficient documentation

## 2018-03-14 DIAGNOSIS — O1210 Gestational proteinuria, unspecified trimester: Secondary | ICD-10-CM | POA: Insufficient documentation

## 2018-04-04 ENCOUNTER — Telehealth (HOSPITAL_COMMUNITY): Payer: Self-pay | Admitting: *Deleted

## 2018-04-08 ENCOUNTER — Other Ambulatory Visit: Payer: Self-pay | Admitting: Obstetrics & Gynecology

## 2018-04-09 ENCOUNTER — Encounter (HOSPITAL_COMMUNITY): Payer: Self-pay | Admitting: *Deleted

## 2018-04-09 NOTE — Telephone Encounter (Signed)
Preadmission screen  

## 2018-04-12 ENCOUNTER — Inpatient Hospital Stay (HOSPITAL_COMMUNITY): Payer: Medicaid Other | Admitting: Anesthesiology

## 2018-04-12 ENCOUNTER — Other Ambulatory Visit: Payer: Self-pay

## 2018-04-12 ENCOUNTER — Inpatient Hospital Stay (HOSPITAL_COMMUNITY)
Admission: RE | Admit: 2018-04-12 | Discharge: 2018-04-13 | DRG: 807 | Disposition: A | Discharge status: Home / Self Care | Payer: Medicaid Other | Attending: Obstetrics & Gynecology | Admitting: Obstetrics & Gynecology

## 2018-04-12 ENCOUNTER — Encounter (HOSPITAL_COMMUNITY): Payer: Self-pay

## 2018-04-12 DIAGNOSIS — O9902 Anemia complicating childbirth: Principal | ICD-10-CM | POA: Diagnosis present

## 2018-04-12 DIAGNOSIS — D571 Sickle-cell disease without crisis: Secondary | ICD-10-CM | POA: Diagnosis present

## 2018-04-12 DIAGNOSIS — N9081 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, unspecified: Secondary | ICD-10-CM | POA: Diagnosis present

## 2018-04-12 DIAGNOSIS — O3483 Maternal care for other abnormalities of pelvic organs, third trimester: Secondary | ICD-10-CM | POA: Diagnosis present

## 2018-04-12 DIAGNOSIS — Z3A38 38 weeks gestation of pregnancy: Secondary | ICD-10-CM

## 2018-04-12 DIAGNOSIS — D5701 Hb-SS disease with acute chest syndrome: Secondary | ICD-10-CM | POA: Diagnosis present

## 2018-04-12 DIAGNOSIS — O1214 Gestational proteinuria, complicating childbirth: Secondary | ICD-10-CM | POA: Diagnosis present

## 2018-04-12 DIAGNOSIS — Z3490 Encounter for supervision of normal pregnancy, unspecified, unspecified trimester: Secondary | ICD-10-CM

## 2018-04-12 LAB — CBC
HCT: 24.9 % — ABNORMAL LOW (ref 36.0–46.0)
Hemoglobin: 8.4 g/dL — ABNORMAL LOW (ref 12.0–15.0)
MCH: 32.1 pg (ref 26.0–34.0)
MCHC: 33.7 g/dL (ref 30.0–36.0)
MCV: 95 fL (ref 80.0–100.0)
Platelets: 449 10*3/uL — ABNORMAL HIGH (ref 150–400)
RBC: 2.62 MIL/uL — ABNORMAL LOW (ref 3.87–5.11)
RDW: 22.6 % — ABNORMAL HIGH (ref 11.5–15.5)
WBC: 12.6 10*3/uL — ABNORMAL HIGH (ref 4.0–10.5)
nRBC: 27.8 % — ABNORMAL HIGH (ref 0.0–0.2)

## 2018-04-12 LAB — TYPE AND SCREEN
ABO/RH(D): O POS
Antibody Screen: NEGATIVE

## 2018-04-12 MED ORDER — ONDANSETRON HCL 4 MG PO TABS: 4.0000 mg | ORAL_TABLET | ORAL | Status: DC | PRN

## 2018-04-12 MED ORDER — WITCH HAZEL-GLYCERIN EX PADS: 1.0000 "application " | MEDICATED_PAD | CUTANEOUS | Status: DC | PRN

## 2018-04-12 MED ORDER — ONDANSETRON HCL 4 MG/2ML IJ SOLN: 4.0000 mg | INTRAMUSCULAR | Status: DC | PRN

## 2018-04-12 MED ORDER — LACTATED RINGERS IV SOLN: INTRAVENOUS | Status: DC

## 2018-04-12 MED ORDER — ZOLPIDEM TARTRATE 5 MG PO TABS: 5.0000 mg | ORAL_TABLET | Freq: Every evening | ORAL | Status: DC | PRN

## 2018-04-12 MED ORDER — PHENYLEPHRINE 40 MCG/ML (10ML) SYRINGE FOR IV PUSH (FOR BLOOD PRESSURE SUPPORT)
80.0000 ug | PREFILLED_SYRINGE | INTRAVENOUS | Status: DC | PRN
  Filled 2018-04-12: qty 5

## 2018-04-12 MED ORDER — ENOXAPARIN SODIUM 40 MG/0.4ML ~~LOC~~ SOLN
40.0000 mg | SUBCUTANEOUS | Status: DC
  Administered 2018-04-13: 40 mg via SUBCUTANEOUS
  Filled 2018-04-12: qty 0.4

## 2018-04-12 MED ORDER — DIBUCAINE 1 % RE OINT: 1.0000 "application " | TOPICAL_OINTMENT | RECTAL | Status: DC | PRN

## 2018-04-12 MED ORDER — LACTATED RINGERS IV SOLN: 500.0000 mL | INTRAVENOUS | Status: DC | PRN

## 2018-04-12 MED ORDER — EPHEDRINE 5 MG/ML INJ
10.0000 mg | INTRAVENOUS | Status: DC | PRN
  Filled 2018-04-12: qty 2

## 2018-04-12 MED ORDER — OXYCODONE-ACETAMINOPHEN 5-325 MG PO TABS: 1.0000 | ORAL_TABLET | ORAL | Status: DC | PRN

## 2018-04-12 MED ORDER — OXYTOCIN BOLUS FROM INFUSION
500.0000 mL | Freq: Once | INTRAVENOUS | Status: AC
  Administered 2018-04-12: 500 mL via INTRAVENOUS

## 2018-04-12 MED ORDER — LACTATED RINGERS IV SOLN
500.0000 mL | Freq: Once | INTRAVENOUS | Status: AC
  Administered 2018-04-12: 1000 mL via INTRAVENOUS

## 2018-04-12 MED ORDER — DIPHENHYDRAMINE HCL 25 MG PO CAPS: 25.0000 mg | ORAL_CAPSULE | Freq: Four times a day (QID) | ORAL | Status: DC | PRN

## 2018-04-12 MED ORDER — FENTANYL 2.5 MCG/ML BUPIVACAINE 1/10 % EPIDURAL INFUSION (WH - ANES)
14.0000 mL/h | INTRAMUSCULAR | Status: DC | PRN
  Administered 2018-04-12: 14 mL/h via EPIDURAL
  Filled 2018-04-12: qty 100

## 2018-04-12 MED ORDER — LIDOCAINE HCL (PF) 1 % IJ SOLN
30.0000 mL | INTRAMUSCULAR | Status: DC | PRN
  Filled 2018-04-12: qty 30

## 2018-04-12 MED ORDER — PRENATAL MULTIVITAMIN CH: 1.0000 | ORAL_TABLET | Freq: Every day | ORAL | Status: DC

## 2018-04-12 MED ORDER — TERBUTALINE SULFATE 1 MG/ML IJ SOLN: 0.2500 mg | Freq: Once | INTRAMUSCULAR | Status: DC | PRN

## 2018-04-12 MED ORDER — OXYTOCIN 40 UNITS IN LACTATED RINGERS INFUSION - SIMPLE MED
2.5000 [IU]/h | INTRAVENOUS | Status: DC
  Administered 2018-04-12: 2.5 [IU]/h via INTRAVENOUS

## 2018-04-12 MED ORDER — ONDANSETRON HCL 4 MG/2ML IJ SOLN: 4.0000 mg | Freq: Four times a day (QID) | INTRAMUSCULAR | Status: DC | PRN

## 2018-04-12 MED ORDER — LACTATED RINGERS IV SOLN
INTRAVENOUS | Status: DC
  Administered 2018-04-12 (×2): via INTRAVENOUS

## 2018-04-12 MED ORDER — BENZOCAINE-MENTHOL 20-0.5 % EX AERO: 1.0000 "application " | INHALATION_SPRAY | CUTANEOUS | Status: DC | PRN

## 2018-04-12 MED ORDER — PRENATAL MULTIVITAMIN CH
1.0000 | ORAL_TABLET | Freq: Every day | ORAL | Status: DC
  Administered 2018-04-13: 1 via ORAL
  Filled 2018-04-12: qty 1

## 2018-04-12 MED ORDER — OXYCODONE-ACETAMINOPHEN 5-325 MG PO TABS: 2.0000 | ORAL_TABLET | ORAL | Status: DC | PRN

## 2018-04-12 MED ORDER — TETANUS-DIPHTH-ACELL PERTUSSIS 5-2.5-18.5 LF-MCG/0.5 IM SUSP: 0.5000 mL | Freq: Once | INTRAMUSCULAR | Status: DC

## 2018-04-12 MED ORDER — IBUPROFEN 600 MG PO TABS
600.0000 mg | ORAL_TABLET | Freq: Four times a day (QID) | ORAL | Status: DC
  Administered 2018-04-12 – 2018-04-13 (×5): 600 mg via ORAL
  Filled 2018-04-12 (×5): qty 1

## 2018-04-12 MED ORDER — LIDOCAINE HCL (PF) 1 % IJ SOLN
INTRAMUSCULAR | Status: DC | PRN
  Administered 2018-04-12: 13 mL via EPIDURAL

## 2018-04-12 MED ORDER — SOD CITRATE-CITRIC ACID 500-334 MG/5ML PO SOLN: 30.0000 mL | ORAL | Status: DC | PRN

## 2018-04-12 MED ORDER — COCONUT OIL OIL: 1.0000 "application " | TOPICAL_OIL | Status: DC | PRN

## 2018-04-12 MED ORDER — LACTATED RINGERS IV SOLN
500.0000 mL | INTRAVENOUS | Status: DC | PRN
  Administered 2018-04-12 (×2): 1000 mL via INTRAVENOUS

## 2018-04-12 MED ORDER — DIPHENHYDRAMINE HCL 50 MG/ML IJ SOLN: 12.5000 mg | INTRAMUSCULAR | Status: DC | PRN

## 2018-04-12 MED ORDER — PHENYLEPHRINE 40 MCG/ML (10ML) SYRINGE FOR IV PUSH (FOR BLOOD PRESSURE SUPPORT)
80.0000 ug | PREFILLED_SYRINGE | INTRAVENOUS | Status: DC | PRN
  Filled 2018-04-12: qty 5
  Filled 2018-04-12: qty 10

## 2018-04-12 MED ORDER — FLEET ENEMA 7-19 GM/118ML RE ENEM: 1.0000 | ENEMA | RECTAL | Status: DC | PRN

## 2018-04-12 MED ORDER — SIMETHICONE 80 MG PO CHEW: 80.0000 mg | CHEWABLE_TABLET | ORAL | Status: DC | PRN

## 2018-04-12 MED ORDER — MISOPROSTOL 25 MCG QUARTER TABLET
25.0000 ug | ORAL_TABLET | ORAL | Status: DC | PRN
  Filled 2018-04-12: qty 1

## 2018-04-12 MED ORDER — FENTANYL CITRATE (PF) 100 MCG/2ML IJ SOLN: 50.0000 ug | INTRAMUSCULAR | Status: DC | PRN

## 2018-04-12 MED ORDER — ACETAMINOPHEN 325 MG PO TABS: 650.0000 mg | ORAL_TABLET | ORAL | Status: DC | PRN

## 2018-04-12 MED ORDER — SENNOSIDES-DOCUSATE SODIUM 8.6-50 MG PO TABS
2.0000 | ORAL_TABLET | ORAL | Status: DC
  Administered 2018-04-13: 2 via ORAL
  Filled 2018-04-12: qty 2

## 2018-04-12 MED ORDER — OXYTOCIN 40 UNITS IN LACTATED RINGERS INFUSION - SIMPLE MED
1.0000 m[IU]/min | INTRAVENOUS | Status: DC
  Administered 2018-04-12: 1 m[IU]/min via INTRAVENOUS
  Filled 2018-04-12: qty 1000

## 2018-04-12 NOTE — Progress Notes (Signed)
Breast Pump initiation by Ivin Poot, RN

## 2018-04-12 NOTE — Anesthesia Pain Management Evaluation Note (Signed)
  [facility] Pain Management Visit Note  Patient: Joy Ferguson, 32 y.o., female  "Hello I am a member of the anesthesia team at [facility]. We have an anesthesia team available at all times to provide care throughout the hospital, including epidural management and anesthesia for C-section. I don't know your plan for the delivery whether it a natural birth, water birth, IV sedation, nitrous supplementation, doula or epidural, but we want to meet your pain goals."   1.Was your pain managed to your expectations on prior hospitalizations?   Yes   2.What is your expectation for pain management during this hospitalization?     Epidural  3.How can we help you reach that goal? epidural  Record the patient's initial score and the patient's pain goal.   Pain: 6  Pain Goal: 4 [facility] wants you to be able to say your pain was always managed very well.  Janeliz Prestwood 04/12/2018

## 2018-04-12 NOTE — Anesthesia Procedure Notes (Signed)
Epidural Patient location during procedure: OB Start time: 04/12/2018 10:28 AM End time: 04/12/2018 10:43 AM  Staffing Anesthesiologist: Lynda Rainwater, MD Performed: anesthesiologist   Preanesthetic Checklist Completed: patient identified, site marked, surgical consent, pre-op evaluation, timeout performed, IV checked, risks and benefits discussed and monitors and equipment checked  Epidural Patient position: sitting Prep: ChloraPrep Patient monitoring: heart rate, cardiac monitor, continuous pulse ox and blood pressure Approach: midline Location: L2-L3 Injection technique: LOR saline  Needle:  Needle type: Tuohy  Needle gauge: 17 G Needle length: 9 cm Needle insertion depth: 4 cm Catheter type: closed end flexible Catheter size: 20 Guage Catheter at skin depth: 8 cm Test dose: negative  Assessment Events: blood not aspirated, injection not painful, no injection resistance, negative IV test and no paresthesia  Additional Notes Reason for block:procedure for pain

## 2018-04-12 NOTE — Anesthesia Postprocedure Evaluation (Signed)
Anesthesia Post Note  Patient: Joy Ferguson  Procedure(s) Performed: AN AD HOC LABOR EPIDURAL     Patient location during evaluation: [facility] Anesthesia Type: Epidural Level of consciousness: awake and alert Pain management: pain level controlled Vital Signs Assessment: post-procedure vital signs reviewed and stable Respiratory status: spontaneous breathing, nonlabored ventilation and respiratory function stable Cardiovascular status: stable Postop Assessment: no headache, no backache, epidural receding, able to ambulate, adequate PO intake, no apparent nausea or vomiting and patient able to bend at knees Anesthetic complications: no    Last Vitals:  Vitals:   04/12/18 1530 04/12/18 1607  BP:  114/75  Pulse:  94  Resp: 20 16  Temp:  37 C  SpO2:  95%    Last Pain:  Vitals:   04/12/18 1700  TempSrc:   PainSc: 7    Pain Goal: Patients Stated Pain Goal: 4 (04/12/18 1700)               [place] Ravens Hristova

## 2018-04-12 NOTE — H&P (Signed)
Joy Ferguson is a 32 y.o. female, G5P0131 at 31+1 weeks, presenting for IOL due to sickle cell disease. She has a history of female genital cutting,one preterm delivery at 34 weeks, three miscarriages, and gestational proteinuria with a PCR of 0.508 on 03/06/18. She is having contractions and grabbing the bedrail during contractions. Reports contractions x 3 days. She is cheerful but quiet, with partner and [facility] interpreter at [facility]. SVE 4/50/-2  Patient Active Problem List   Diagnosis Date Noted  . Family history of consanguinity 10/12/2017  . [redacted] weeks gestation of pregnancy   . Abdominal pain, chronic, epigastric 09/28/2015  . Vitamin D deficiency 02/10/2015  . HCAP (healthcare-associated pneumonia) 01/14/2015  . Acute chest syndrome due to sickle-cell disease--seen at [place] and [facility] 12/2014 01/07/2015  . Preterm delivery 01/07/2015  . Sickle cell anemia ([place]) 09/24/2014    History of present pregnancy: Patient entered care at 13 weeks.   EDC of 04/25/18 was established by LMP and 11 week [name].   Anatomy scan:  21+5 weeks, with normal findings and a posterior placenta.   Additional [name] evaluations:  BPP 8/8, AFI 20 on 04/09/18 .   Significant prenatal events:  Seen for thyroid nodules     OB History    Gravida  5   Para  1   Term      Preterm  1   AB  3   Living  1     SAB  3   TAB      Ectopic      Multiple  0   Live Births  1          Past Medical History:  Diagnosis Date  . Anemia   . Female circumcision   . GERD (gastroesophageal reflux disease)   . Gestational proteinuria   . Sickle cell anemia ([facility])   . Thrombocytosis ([place])   . Thyroid nodule    Past Surgical History:  Procedure Laterality Date  . VAGINA SURGERY     Family History: family history includes Diabetes in her paternal grandmother; Hypertension in her father and paternal grandmother. Social History:  reports that she has never smoked. She has never used smokeless tobacco. She  reports that she does not drink alcohol or use drugs.   Prenatal Transfer Tool  Maternal Diabetes: No Genetic Screening: Declined Maternal Ultrasounds/Referrals: Normal Fetal Ultrasounds or other Referrals:  Referred to Materal Fetal Medicine  Maternal Substance Abuse:  No Significant Maternal Medications:  Meds include: Other: Makena  Significant Maternal Lab Results: Lab values include: Group B Strep negative  TDAP 03/13/2018 Flu 03/20/2018  ROS:  All 10 systems reviewed and negative except as noted. +FM, Denies H/A, visual changes. Endorses some sharp RUQ pain, positional and possibly due to angle of fetus. Denies VB and LOF.  No Known Allergies    Last menstrual period 07/19/2017, unknown if currently breastfeeding.  Chest clear Heart RRR without murmur Abd gravid, NT, FH 140 moderate variability, initially some variable decels, some accels. Ext: negative for sx of DVT  FHR: Category 2, receiving bolus and position change to resolve variable decels UCs:  2-3 in 10 minutes Vitals:   04/12/18 0756 04/12/18 0859  BP: 106/65   Pulse: 84   Resp: 20   Temp: 97.7 F (36.5 C)   TempSrc: Oral   Weight:  68 kg  Height:  5\' 4"  (1.626 m)    Prenatal labs: ABO, Rh: O/Positive/-- (05/20 0000) Antibody: Negative (05/20 0000) Rubella:  Immune (  05/20 0000) RPR: Nonreactive (05/20 0000)  HBsAg: Negative (05/20 0000)  HIV: Non-reactive (05/20 0000)  GBS:  neg Sickle cell/Hgb electrophoresis:  SS GC:  Neg  Chlamydia:  neg Genetic screenings:  declined Glucola:  WNL Hgb 9.0 at NOB, 8.3 at 28 weeks    Assessment/Plan: IUP at 38+1 IOL for Hb SS Gestational proteinuria Category I tracing as of 0900  Plan: Admit to [facility] per consult with [facility] Pitocin as tolerated Routine [name] orders Pain med/epidural prn Anticipate SVD  Altha Harm, CNM 04/12/2018, 7:37 AM

## 2018-04-12 NOTE — Anesthesia Preprocedure Evaluation (Signed)
Anesthesia Evaluation  Patient identified by MRN, date of birth, ID band Patient awake    Reviewed: Allergy & Precautions, NPO status , Patient's Chart, lab work & pertinent test results  Airway Mallampati: II  TM Distance: >3 FB Neck ROM: Full    Dental no notable dental hx.    Pulmonary neg pulmonary ROS,    Pulmonary exam normal breath sounds clear to auscultation       Cardiovascular negative cardio ROS Normal cardiovascular exam Rhythm:Regular Rate:Normal     Neuro/Psych negative neurological ROS  negative psych ROS   GI/Hepatic Neg liver ROS, GERD  ,  Endo/Other  negative endocrine ROS  Renal/GU negative Renal ROS  negative genitourinary   Musculoskeletal negative musculoskeletal ROS (+)   Abdominal   Peds negative pediatric ROS (+)  Hematology negative hematology ROS (+) Sickle cell anemia ,   Anesthesia Other Findings   Reproductive/Obstetrics (+) Pregnancy                             Anesthesia Physical Anesthesia Plan  ASA: III  Anesthesia Plan: Epidural   Post-op Pain Management:    Induction:   PONV Risk Score and Plan:   Airway Management Planned:   Additional Equipment:   Intra-op Plan:   Post-operative Plan:   Informed Consent:   Plan Discussed with:   Anesthesia Plan Comments:         Anesthesia Quick Evaluation

## 2018-04-12 NOTE — Lactation Note (Signed)
This note was copied from a baby's chart. Lactation Consultation Note NICU baby 7 hrs old.  Mom has DEBP in rm. Hasn't pumped yet. FOB stated she will pump later tonight after visitors leave.  Mom breast/formula fed her first child. Stated had painful latches. Unable to assess mom's breast d/t visitors at this time.  Discussed supply and demand, importance of pumping every three hours, breast massage, hand expression after pumping, and milk storage. Encouraged to call staff for assistance or questions. NICU book as well as [facility] brochure given w/resources, support groups and [place] services.  Patient Name: Joy Ferguson [id number] Date: 04/12/2018 Reason for consult: Initial assessment;NICU baby;Early term 37-38.6wks;Infant < 6lbs   Maternal Data Has patient been taught Hand Expression?: Yes Does the patient have breastfeeding experience prior to this delivery?: Yes  Feeding    LATCH Score                   Interventions Interventions: DEBP  Lactation Tools Discussed/Used Tools: Pump Breast pump type: Double-Electric Breast Pump Pump Review: Setup, frequency, and cleaning;Milk Storage Initiated by:: RN Date initiated:: 04/12/18   Consult Status Consult Status: Follow-up Date: 04/13/18 Follow-up type: In-patient    Theodoro Kalata 04/12/2018, 8:42 PM

## 2018-04-13 LAB — CBC
HCT: 21.3 % — ABNORMAL LOW (ref 36.0–46.0)
Hemoglobin: 7.2 g/dL — ABNORMAL LOW (ref 12.0–15.0)
MCH: 32.1 pg (ref 26.0–34.0)
MCHC: 33.8 g/dL (ref 30.0–36.0)
MCV: 95.1 fL (ref 80.0–100.0)
Platelets: 365 10*3/uL (ref 150–400)
RBC: 2.24 MIL/uL — ABNORMAL LOW (ref 3.87–5.11)
RDW: 22.6 % — AB (ref 11.5–15.5)
WBC: 16.2 10*3/uL — ABNORMAL HIGH (ref 4.0–10.5)
nRBC: 19.6 % — ABNORMAL HIGH (ref 0.0–0.2)

## 2018-04-13 LAB — RPR: RPR Ser Ql: NONREACTIVE

## 2018-04-13 MED ORDER — IBUPROFEN 600 MG PO TABS: 600.0000 mg | ORAL_TABLET | Freq: Four times a day (QID) | ORAL | 0 refills | Status: DC | PRN

## 2018-04-13 MED ORDER — FERROUS SULFATE 325 (65 FE) MG PO TABS
325.0000 mg | ORAL_TABLET | Freq: Every day | ORAL | Status: DC
  Administered 2018-04-13: 325 mg via ORAL
  Filled 2018-04-13: qty 1

## 2018-04-13 MED ORDER — FERROUS SULFATE 325 (65 FE) MG PO TABS: 325.0000 mg | ORAL_TABLET | Freq: Every day | ORAL | 2 refills | Status: DC

## 2018-04-13 NOTE — Progress Notes (Signed)
Pt discharged with printed instructions. Pt verbalized an understanding. No concerns noted. Sung [place] L Karon Cotterill, RN 

## 2018-04-13 NOTE — Progress Notes (Signed)
Post Partum Day 1 Subjective: no complaints, up ad lib, voiding and tolerating PO. Patient requested an abdominal binder. Mild asymptomatic anemia.  Objective: Vitals:   04/12/18 1607 04/12/18 1931 04/12/18 2358 04/13/18 0411  BP: 114/75 109/64 (!) 97/59 100/70  Pulse: 94  83 76  Resp: 16 17 16 16   Temp: 98.6 F (37 C) 98 F (36.7 C) 98.1 F (36.7 C) 98.4 F (36.9 C)  TempSrc: Oral Oral Oral Oral  SpO2: 95% 98% 96% 95%  Weight:      Height:        Physical Exam:  General: alert and cooperative Lochia: appropriate Uterine Fundus: firm Incision: healing well, no significant drainage, no dehiscence, no significant erythema DVT Evaluation: No evidence of DVT seen on physical exam. Negative Homan's sign. No cords or calf tenderness. No significant calf/ankle edema.  Recent Labs    04/12/18 0803 04/13/18 0526  HGB 8.4* 7.2*  HCT 24.9* 21.3*    Assessment/Plan: Plan for discharge tomorrow   LOS: 1 day   Joy Ferguson 04/13/2018, 8:23 AM

## 2018-04-13 NOTE — Discharge Summary (Signed)
OB Discharge Summary     Patient Name: Joy Ferguson DOB: Feb 22, 1986 MRN: [id number]  Date of admission: 04/12/2018 Delivering MD: Altha Harm A   Date of discharge: 04/13/2018  Admitting diagnosis: 38wks induction Intrauterine pregnancy: [redacted]w[redacted]d     Secondary diagnosis:  Active Problems:   Sickle cell anemia in mother affecting childbirth [facility])   Vaginal delivery   Discharge diagnosis: Term Pregnancy Delivered and Anemia                                                                                                Post partum procedures:n/a  Augmentation: AROM and Pitocin  Complications: None  Hospital course:  Induction of Labor With Vaginal Delivery   32 y.o. yo [id number] at [redacted]w[redacted]d was admitted to the hospital 04/12/2018 for induction of labor.  Indication for induction: sickle cell disease.  Patient had an uncomplicated labor course as follows: Membrane Rupture Time/Date: 11:36 AM ,04/12/2018   Intrapartum Procedures: Episiotomy: None [1]                                         Lacerations:  1st degree [2];Perineal [11];Periurethral [8]  Patient had delivery of a Viable infant.  Information for the patient's newborn:  Clarine, Elrod [[id number]]      04/12/2018  Details of delivery can be found in separate delivery note.  Patient had a routine postpartum course. Patient is discharged home 04/13/18.  Physical exam  Vitals:   04/12/18 1931 04/12/18 2358 04/13/18 0411 04/13/18 0828  BP: 109/64 (!) 97/59 100/70 103/73  Pulse:  83 76 84  Resp: 17 16 16 16   Temp: 98 F (36.7 C) 98.1 F (36.7 C) 98.4 F (36.9 C) 98.4 F (36.9 C)  TempSrc: Oral Oral Oral Oral  SpO2: 98% 96% 95% 93%  Weight:      Height:       General: alert, cooperative and no distress Lochia: appropriate Uterine Fundus: firm Incision: N/A DVT Evaluation: No evidence of DVT seen on physical exam. Negative Homan's sign. No cords or calf tenderness. No significant calf/ankle edema. Proteinuria:  patient denies symptoms of preeclampsia. Precautions given. Per Dr. Cletis Media, patient is normotensive and stable for discharge without needing further lab testing.   Labs: Lab Results  Component Value Date   WBC 16.2 (H) 04/13/2018   HGB 7.2 (L) 04/13/2018   HCT 21.3 (L) 04/13/2018   MCV 95.1 04/13/2018   PLT 365 04/13/2018   CMP Latest Ref Rng & Units 02/09/2015  Glucose 65 - 99 mg/dL 105(H)  BUN 7 - 25 mg/dL 10  Creatinine 0.50 - 1.10 mg/dL 0.44(L)  Sodium 135 - 146 mmol/L 137  Potassium 3.5 - 5.3 mmol/L 4.0  Chloride 98 - 110 mmol/L 104  CO2 20 - 31 mmol/L 23  Calcium 8.6 - 10.2 mg/dL 9.4  Total Protein 6.1 - 8.1 g/dL 7.4  Total Bilirubin 0.2 - 1.2 mg/dL 1.2  Alkaline Phos 33 - 115 U/L 121(H)  AST 10 - 30 U/L  36(H)  ALT 6 - 29 U/L 42(H)    Discharge instruction: per After Visit Summary and "Baby and Me Booklet". Preeclampsia and sickle cell disease precautions given.   After visit meds:  Allergies as of 04/13/2018   No Known Allergies     Medication List    TAKE these medications   acetaminophen 325 MG tablet Commonly known as:  TYLENOL Take 650 mg by mouth every 6 (six) hours as needed for mild pain.   ferrous sulfate 325 (65 FE) MG tablet Take 1 tablet (325 mg total) by mouth daily with breakfast. Start taking on:  04/14/2018   ibuprofen 600 MG tablet Commonly known as:  ADVIL,MOTRIN Take 1 tablet (600 mg total) by mouth every 6 (six) hours as needed for mild pain or moderate pain.   prenatal multivitamin Tabs tablet Take 1 tablet by mouth daily at 12 noon.       Diet: routine diet, PO iron supplementation daily for asymptomatic anemia.   Activity: Advance as tolerated. Pelvic rest for 6 weeks.   Outpatient follow up:6 weeks Follow up Appt:No future appointments. Follow up Visit:No follow-ups on file.  Postpartum contraception: Not Discussed  Newborn Data: Live born female  Birth Weight: 5 lb 5 oz (2410 g) APGAR: 7, 8  Newborn Delivery   Birth  date/time:  04/12/2018 13:03:00 Delivery type:  Vaginal, Spontaneous     Baby Feeding: Bottle and Breast Disposition:Transfer to [place] for further care   04/13/2018 Marikay Alar, CNM

## 2018-04-13 NOTE — Lactation Note (Signed)
This note was copied from a baby's chart. Lactation Consultation Note  Patient Name: Joy Ferguson Date: 04/13/2018    [facility] Initial Visit:  Mother was not in her room; will attempt to return later today.                   Joy Ferguson [name] 04/13/2018, 11:16 AM

## 2018-04-13 NOTE — Discharge Instructions (Signed)
Postpartum Care After Vaginal Delivery  The period of time right after you deliver your newborn is called the postpartum period. What kind of medical care will I receive?  You may continue to receive fluids and medicines through an IV tube inserted into one of your veins.  If an incision was made near your vagina (episiotomy) or if you had some vaginal tearing during delivery, cold compresses may be placed on your episiotomy or your tear. This helps to reduce pain and swelling.  You may be given a squirt bottle to use when you go to the bathroom. You may use this until you are comfortable wiping as usual. To use the squirt bottle, follow these steps: ? Before you urinate, fill the squirt bottle with warm water. Do not use hot water. ? After you urinate, while you are sitting on the toilet, use the squirt bottle to rinse the area around your urethra and vaginal opening. This rinses away any urine and blood. ? You may do this instead of wiping. As you start healing, you may use the squirt bottle before wiping yourself. Make sure to wipe gently. ? Fill the squirt bottle with clean water every time you use the bathroom.  You will be given sanitary pads to wear. How can I expect to feel?  You may not feel the need to urinate for several hours after delivery.  You will have some soreness and pain in your abdomen and vagina.  If you are breastfeeding, you may have uterine contractions every time you breastfeed for up to several weeks postpartum. Uterine contractions help your uterus return to its normal size.  It is normal to have vaginal bleeding (lochia) after delivery. The amount and appearance of lochia is often similar to a menstrual period in the first week after delivery. It will gradually decrease over the next few weeks to a dry, yellow-brown discharge. For most women, lochia stops completely by 6-8 weeks after delivery. Vaginal bleeding can vary from woman to woman.  Within the first few  days after delivery, you may have breast engorgement. This is when your breasts feel heavy, full, and uncomfortable. Your breasts may also throb and feel hard, tightly stretched, warm, and tender. After this occurs, you may have milk leaking from your breasts. Your health care provider can help you relieve discomfort due to breast engorgement. Breast engorgement should go away within a few days.  You may feel more sad or worried than normal due to hormonal changes after delivery. These feelings should not last more than a few days. If these feelings do not go away after several days, speak with your health care provider. How should I care for myself?  Tell your health care provider if you have pain or discomfort.  Drink enough water to keep your urine clear or pale yellow.  Wash your hands thoroughly with soap and water for at least 20 seconds after changing your sanitary pads, after using the toilet, and before holding or feeding your baby.  If you are not breastfeeding, avoid touching your breasts a lot. Doing this can make your breasts produce more milk.  If you become weak or lightheaded, or you feel like you might faint, ask for help before: ? Getting out of bed. ? Showering.  Change your sanitary pads frequently. Watch for any changes in your flow, such as a sudden increase in volume, a change in color, the passing of large blood clots. If you pass a blood clot from your vagina,  save it to show to your health care provider. Do not flush blood clots down the toilet without having your health care provider look at them.  Make sure that all your vaccinations are up to date. This can help protect you and your baby from getting certain diseases. You may need to have immunizations done before you leave the hospital.  If desired, talk with your health care provider about methods of family planning or birth control (contraception). How can I start bonding with my baby? Spending as much time as  possible with your baby is very important. During this time, you and your baby can get to know each other and develop a bond. Having your baby stay with you in your room (rooming in) can give you time to get to know your baby. Rooming in can also help you become comfortable caring for your baby. Breastfeeding can also help you bond with your baby. How can I plan for returning home with my baby?  Make sure that you have a car seat installed in your vehicle. ? Your car seat should be checked by a certified car seat installer to make sure that it is installed safely. ? Make sure that your baby fits into the car seat safely.  Ask your health care provider any questions you have about caring for yourself or your baby. Make sure that you are able to contact your health care provider with any questions after leaving the hospital. This information is not intended to replace advice given to you by your health care provider. Make sure you discuss any questions you have with your health care provider. Document Released: 02/26/2007 Document Revised: 10/04/2015 Document Reviewed: 04/05/2015 Elsevier Interactive Patient Education  2018 [employer].   Postpartum Depression and Baby Blues The postpartum period begins right after the birth of a baby. During this time, there is often a great amount of joy and excitement. It is also a time of many changes in the life of the parents. Regardless of how many times a mother gives birth, each child brings new challenges and dynamics to the family. It is not unusual to have feelings of excitement along with confusing shifts in moods, emotions, and thoughts. All mothers are at risk of developing postpartum depression or the "baby blues." These mood changes can occur right after giving birth, or they may occur many months after giving birth. The baby blues or postpartum depression can be mild or severe. Additionally, postpartum depression can go away rather quickly, or it can  be a long-term condition. What are the causes? Raised hormone levels and the rapid drop in those levels are thought to be a main cause of postpartum depression and the baby blues. A number of hormones change during and after pregnancy. Estrogen and progesterone usually decrease right after the delivery of your baby. The levels of thyroid hormone and various cortisol steroids also rapidly drop. Other factors that play a role in these mood changes include major life events and genetics. What increases the risk? If you have any of the following risks for the baby blues or postpartum depression, know what symptoms to watch out for during the postpartum period. Risk factors that may increase the likelihood of getting the baby blues or postpartum depression include:  Having a personal or family history of depression.  Having depression while being pregnant.  Having premenstrual mood issues or mood issues related to oral contraceptives.  Having a lot of life stress.  Having marital conflict.  Lacking  a social support network.  Having a baby with special needs.  Having health problems, such as diabetes.  What are the signs or symptoms? Symptoms of baby blues include:  Brief changes in mood, such as going from extreme happiness to sadness.  Decreased concentration.  Difficulty sleeping.  Crying spells, tearfulness.  Irritability.  Anxiety.  Symptoms of postpartum depression typically begin within the first month after giving birth. These symptoms include:  Difficulty sleeping or excessive sleepiness.  Marked weight loss.  Agitation.  Feelings of worthlessness.  Lack of interest in activity or food.  Postpartum psychosis is a very serious condition and can be dangerous. Fortunately, it is rare. Displaying any of the following symptoms is cause for immediate medical attention. Symptoms of postpartum psychosis include:  Hallucinations and delusions.  Bizarre or disorganized  behavior.  Confusion or disorientation.  How is this diagnosed? A diagnosis is made by an evaluation of your symptoms. There are no medical or lab tests that lead to a diagnosis, but there are various questionnaires that a health care provider may use to identify those with the baby blues, postpartum depression, or psychosis. Often, a screening tool called the [place] Postnatal Depression Scale is used to diagnose depression in the postpartum period. How is this treated? The baby blues usually goes away on its own in 1-2 weeks. Social support is often all that is needed. You will be encouraged to get adequate sleep and rest. Occasionally, you may be given medicines to help you sleep. Postpartum depression requires treatment because it can last several months or longer if it is not treated. Treatment may include individual or group therapy, medicine, or both to address any social, physiological, and psychological factors that may play a role in the depression. Regular exercise, a healthy diet, rest, and social support may also be strongly recommended. Postpartum psychosis is more serious and needs treatment right away. Hospitalization is often needed. Follow these instructions at home:  Get as much rest as you can. Nap when the baby sleeps.  Exercise regularly. Some women find yoga and walking to be beneficial.  Eat a balanced and nourishing diet.  Do little things that you enjoy. Have a cup of tea, take a bubble bath, read your favorite magazine, or listen to your favorite music.  Avoid alcohol.  Ask for help with household chores, cooking, grocery shopping, or running errands as needed. Do not try to do everything.  Talk to people close to you about how you are feeling. Get support from your partner, family members, friends, or other new moms.  Try to stay positive in how you think. Think about the things you are grateful for.  Do not spend a lot of time alone.  Only take  over-the-counter or prescription medicine as directed by your health care provider.  Keep all your postpartum appointments.  Let your health care provider know if you have any concerns. Contact a health care provider if: You are having a reaction to or problems with your medicine. Get help right away if:  You have suicidal feelings.  You think you may harm the baby or someone else. This information is not intended to replace advice given to you by your health care provider. Make sure you discuss any questions you have with your health care provider. Document Released: 02/03/2004 Document Revised: 10/07/2015 Document Reviewed: 02/10/2013 Elsevier Interactive Patient Education  2017 [employer] Inc[employer]   Sickle Cell Anemia, Adult Sickle cell anemia is a condition where your red blood cells  are shaped like sickles. Red blood cells carry oxygen through the body. Sickle-shaped red blood cells do not live as long as normal red blood cells. They also clump together and block blood from flowing through the blood vessels. These things prevent the body from getting enough oxygen. Sickle cell anemia causes organ damage and pain. It also increases the risk of infection. Follow these instructions at home:  Drink enough fluid to keep your pee (urine) clear or pale yellow. Drink more in hot weather and during exercise.  Do not smoke. Smoking lowers oxygen levels in the blood.  Only take over-the-counter or prescription medicines as told by your doctor.  Take antibiotic medicines as told by your doctor. Make sure you finish them even if you start to feel better.  Take supplements as told by your doctor.  Consider wearing a medical alert bracelet. This tells anyone caring for you in an emergency of your condition.  When traveling, keep your medical information, doctors' names, and the medicines you take with you at all times.  If you have a fever, do not take fever medicines right away. This could cover  up a problem. Tell your doctor.  Keep all follow-up visits with your doctor. Sickle cell anemia requires regular medical care. Contact a doctor if: You have a fever. Get help right away if:  You feel dizzy or faint.  You have new belly (abdominal) pain, especially on the left side near the stomach area.  You have a lasting, often uncomfortable and painful erection of the penis (priapism). If it is not treated right away, you will become unable to have sex (impotence).  You have numbness in your arms or legs or you have a hard time moving them.  You have a hard time talking.  You have a fever or lasting symptoms for more than 2-3 days.  You have a fever and your symptoms suddenly get worse.  You have signs or symptoms of infection. These include: ? Chills. ? Being more tired than normal (lethargy). ? Irritability. ? Poor eating. ? Throwing up (vomiting).  You have pain that is not helped with medicine.  You have shortness of breath.  You have pain in your chest.  You are coughing up pus-like or bloody mucus.  You have a stiff neck.  Your feet or hands swell or have pain.  Your belly looks bloated.  Your joints hurt. This information is not intended to replace advice given to you by your health care provider. Make sure you discuss any questions you have with your health care provider. Document Released: 02/19/2013 Document Revised: 10/07/2015 Document Reviewed: 12/11/2012 Elsevier Interactive Patient Education  2017 Elsevier Inc.   Preeclampsia and Eclampsia Preeclampsia is a serious condition that develops only during pregnancy. It is also called toxemia of pregnancy. This condition causes high blood pressure along with other symptoms, such as swelling and headaches. These symptoms may develop as the condition gets worse. Preeclampsia may occur at 20 weeks of pregnancy or later. Diagnosing and treating preeclampsia early is very important. If not treated early, it  can cause serious problems for you and your baby. One problem it can lead to is eclampsia, which is a condition that causes muscle jerking or shaking (convulsions or seizures) in the mother. Delivering your baby is the best treatment for preeclampsia or eclampsia. Preeclampsia and eclampsia symptoms usually go away after your baby is born. What are the causes? The cause of preeclampsia is not known. What increases the risk?  The following risk factors make you more likely to develop preeclampsia:  Being pregnant for the first time.  Having had preeclampsia during a past pregnancy.  Having a family history of preeclampsia.  Having high blood pressure.  Being pregnant with twins or triplets.  Being 30 or older.  Being African-American.  Having kidney disease or diabetes.  Having medical conditions such as lupus or blood diseases.  Being very overweight (obese).  What are the signs or symptoms? The earliest signs of preeclampsia are:  High blood pressure.  Increased protein in your urine. Your health care provider will check for this at every visit before you give birth (prenatal visit).  Other symptoms that may develop as the condition gets worse include:  Severe headaches.  Sudden weight gain.  Swelling of the hands, face, legs, and feet.  Nausea and vomiting.  Vision problems, such as blurred or double vision.  Numbness in the face, arms, legs, and feet.  Urinating less than usual.  Dizziness.  Slurred speech.  Abdominal pain, especially upper abdominal pain.  Convulsions or seizures.  Symptoms generally go away after giving birth. How is this diagnosed? There are no screening tests for preeclampsia. Your health care provider will ask you about symptoms and check for signs of preeclampsia during your prenatal visits. You may also have tests that include:  Urine tests.  Blood tests.  Checking your blood pressure.  Monitoring your babys heart  rate.  Ultrasound.  How is this treated? You and your health care provider will determine the treatment approach that is best for you. Treatment may include:  Having more frequent prenatal exams to check for signs of preeclampsia, if you have an increased risk for preeclampsia.  Bed rest.  Reducing how much salt (sodium) you eat.  Medicine to lower your blood pressure.  Staying in the hospital, if your condition is severe. There, treatment will focus on controlling your blood pressure and the amount of fluids in your body (fluid retention).  You may need to take medicine (magnesium sulfate) to prevent seizures. This medicine may be given as an injection or through an IV tube.  Delivering your baby early, if your condition gets worse. You may have your labor started with medicine (induced), or you may have a cesarean delivery.  Follow these instructions at home: Eating and drinking   Drink enough fluid to keep your urine clear or pale yellow.  Eat a healthy diet that is low in sodium. Do not add salt to your food. Check nutrition labels to see how much sodium a food or beverage contains.  Avoid caffeine. Lifestyle  Do not use any products that contain nicotine or tobacco, such as cigarettes and e-cigarettes. If you need help quitting, ask your health care provider.  Do not use alcohol or drugs.  Avoid stress as much as possible. Rest and get plenty of sleep. General instructions  Take over-the-counter and prescription medicines only as told by your health care provider.  When lying down, lie on your side. This keeps pressure off of your baby.  When sitting or lying down, raise (elevate) your feet. Try putting some pillows underneath your lower legs.  Exercise regularly. Ask your health care provider what kinds of exercise are best for you.  Keep all follow-up and prenatal visits as told by your health care provider. This is important. How is this prevented? To prevent  preeclampsia or eclampsia from developing during another pregnancy:  Get proper medical care during pregnancy. Your health  care provider may be able to prevent preeclampsia or diagnose and treat it early.  Your health care provider may have you take a low-dose aspirin or a calcium supplement during your next pregnancy.  You may have tests of your blood pressure and kidney function after giving birth.  Maintain a healthy weight. Ask your health care provider for help managing weight gain during pregnancy.  Work with your health care provider to manage any long-term (chronic) health conditions you have, such as diabetes or kidney problems.  Contact a health care provider if:  You gain more weight than expected.  You have headaches.  You have nausea or vomiting.  You have abdominal pain.  You feel dizzy or light-headed. Get help right away if:  You develop sudden or severe swelling anywhere in your body. This usually happens in the legs.  You gain 5 lbs (2.3 kg) or more during one week.  You have severe: ? Abdominal pain. ? Headaches. ? Dizziness. ? Vision problems. ? Confusion. ? Nausea or vomiting.  You have a seizure.  You have trouble moving any part of your body.  You develop numbness in any part of your body.  You have trouble speaking.  You have any abnormal bleeding.  You pass out. This information is not intended to replace advice given to you by your health care provider. Make sure you discuss any questions you have with your health care provider. Document Released: 04/28/2000 Document Revised: 12/28/2015 Document Reviewed: 12/06/2015 Elsevier Interactive Patient Education  [employer].

## 2018-04-13 NOTE — Progress Notes (Signed)
Patient screened out for psychosocial assessment since none of the following apply: °Psychosocial stressors documented in mother or baby's chart °Gestation less than 32 weeks °Code at delivery  °Infant with anomalies °Please contact the Clinical Social Worker if specific needs arise, by MOB's request, or if MOB scores greater than 9/yes to question 10 on [place] Postpartum Depression Screen. ° °Karalyn Kadel Boyd-Gilyard, MSW, LCSW °Clinical Social Work °[phone] °  °

## 2018-11-06 ENCOUNTER — Other Ambulatory Visit: Payer: Self-pay | Admitting: Cardiovascular Disease

## 2018-11-06 DIAGNOSIS — N644 Mastodynia: Secondary | ICD-10-CM

## 2020-12-12 DIAGNOSIS — O09529 Supervision of elderly multigravida, unspecified trimester: Secondary | ICD-10-CM | POA: Insufficient documentation

## 2020-12-13 DIAGNOSIS — D571 Sickle-cell disease without crisis: Secondary | ICD-10-CM | POA: Diagnosis not present

## 2020-12-13 DIAGNOSIS — Z8751 Personal history of pre-term labor: Secondary | ICD-10-CM | POA: Diagnosis not present

## 2020-12-13 DIAGNOSIS — Z113 Encounter for screening for infections with a predominantly sexual mode of transmission: Secondary | ICD-10-CM | POA: Diagnosis not present

## 2020-12-13 DIAGNOSIS — N925 Other specified irregular menstruation: Secondary | ICD-10-CM | POA: Diagnosis not present

## 2020-12-13 DIAGNOSIS — Z1329 Encounter for screening for other suspected endocrine disorder: Secondary | ICD-10-CM | POA: Diagnosis not present

## 2020-12-13 LAB — OB RESULTS CONSOLE RUBELLA ANTIBODY, IGM: Rubella: IMMUNE

## 2020-12-13 LAB — OB RESULTS CONSOLE HIV ANTIBODY (ROUTINE TESTING): HIV: NONREACTIVE

## 2020-12-13 LAB — OB RESULTS CONSOLE GC/CHLAMYDIA
Chlamydia: NEGATIVE
Gonorrhea: NEGATIVE

## 2020-12-13 LAB — OB RESULTS CONSOLE ABO/RH: RH Type: POSITIVE

## 2020-12-13 LAB — OB RESULTS CONSOLE RPR: RPR: NONREACTIVE

## 2020-12-13 LAB — OB RESULTS CONSOLE ANTIBODY SCREEN: Antibody Screen: NEGATIVE

## 2020-12-13 LAB — OB RESULTS CONSOLE HEPATITIS B SURFACE ANTIGEN: Hepatitis B Surface Ag: NEGATIVE

## 2020-12-13 LAB — HEPATITIS C ANTIBODY: HCV Ab: NEGATIVE

## 2021-02-04 DIAGNOSIS — Z8751 Personal history of pre-term labor: Secondary | ICD-10-CM | POA: Diagnosis not present

## 2021-02-10 DIAGNOSIS — Z8751 Personal history of pre-term labor: Secondary | ICD-10-CM | POA: Diagnosis not present

## 2021-02-17 DIAGNOSIS — R0600 Dyspnea, unspecified: Secondary | ICD-10-CM | POA: Diagnosis not present

## 2021-02-17 DIAGNOSIS — E041 Nontoxic single thyroid nodule: Secondary | ICD-10-CM | POA: Diagnosis not present

## 2021-02-17 DIAGNOSIS — Z608 Other problems related to social environment: Secondary | ICD-10-CM | POA: Diagnosis not present

## 2021-02-17 DIAGNOSIS — Z8751 Personal history of pre-term labor: Secondary | ICD-10-CM | POA: Diagnosis not present

## 2021-02-17 DIAGNOSIS — D571 Sickle-cell disease without crisis: Secondary | ICD-10-CM | POA: Diagnosis not present

## 2021-02-17 DIAGNOSIS — Z331 Pregnant state, incidental: Secondary | ICD-10-CM | POA: Diagnosis not present

## 2021-02-17 DIAGNOSIS — N9081 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, unspecified: Secondary | ICD-10-CM | POA: Diagnosis not present

## 2021-02-17 DIAGNOSIS — Z8759 Personal history of other complications of pregnancy, childbirth and the puerperium: Secondary | ICD-10-CM | POA: Diagnosis not present

## 2021-02-17 DIAGNOSIS — O09512 Supervision of elderly primigravida, second trimester: Secondary | ICD-10-CM | POA: Diagnosis not present

## 2021-02-17 DIAGNOSIS — Z363 Encounter for antenatal screening for malformations: Secondary | ICD-10-CM | POA: Diagnosis not present

## 2021-02-24 DIAGNOSIS — Z8751 Personal history of pre-term labor: Secondary | ICD-10-CM | POA: Diagnosis not present

## 2021-02-25 DIAGNOSIS — D571 Sickle-cell disease without crisis: Secondary | ICD-10-CM | POA: Diagnosis not present

## 2021-02-25 DIAGNOSIS — O26892 Other specified pregnancy related conditions, second trimester: Secondary | ICD-10-CM | POA: Diagnosis not present

## 2021-02-25 DIAGNOSIS — Z3689 Encounter for other specified antenatal screening: Secondary | ICD-10-CM | POA: Diagnosis not present

## 2021-02-25 DIAGNOSIS — R079 Chest pain, unspecified: Secondary | ICD-10-CM | POA: Diagnosis not present

## 2021-02-25 DIAGNOSIS — O0992 Supervision of high risk pregnancy, unspecified, second trimester: Secondary | ICD-10-CM | POA: Diagnosis not present

## 2021-02-25 DIAGNOSIS — O09522 Supervision of elderly multigravida, second trimester: Secondary | ICD-10-CM | POA: Diagnosis not present

## 2021-02-25 DIAGNOSIS — Z843 Family history of consanguinity: Secondary | ICD-10-CM | POA: Diagnosis not present

## 2021-03-03 DIAGNOSIS — Z8759 Personal history of other complications of pregnancy, childbirth and the puerperium: Secondary | ICD-10-CM | POA: Diagnosis not present

## 2021-03-04 DIAGNOSIS — O09899 Supervision of other high risk pregnancies, unspecified trimester: Secondary | ICD-10-CM | POA: Insufficient documentation

## 2021-03-04 DIAGNOSIS — Z2839 Other underimmunization status: Secondary | ICD-10-CM | POA: Insufficient documentation

## 2021-03-10 DIAGNOSIS — Z8759 Personal history of other complications of pregnancy, childbirth and the puerperium: Secondary | ICD-10-CM | POA: Diagnosis not present

## 2021-03-15 DIAGNOSIS — O09512 Supervision of elderly primigravida, second trimester: Secondary | ICD-10-CM | POA: Diagnosis not present

## 2021-03-15 DIAGNOSIS — Z3A27 27 weeks gestation of pregnancy: Secondary | ICD-10-CM | POA: Diagnosis not present

## 2021-03-15 DIAGNOSIS — Z3403 Encounter for supervision of normal first pregnancy, third trimester: Secondary | ICD-10-CM | POA: Diagnosis not present

## 2021-03-18 DIAGNOSIS — Z8759 Personal history of other complications of pregnancy, childbirth and the puerperium: Secondary | ICD-10-CM | POA: Diagnosis not present

## 2021-03-24 DIAGNOSIS — Z8751 Personal history of pre-term labor: Secondary | ICD-10-CM | POA: Diagnosis not present

## 2021-03-30 DIAGNOSIS — O99810 Abnormal glucose complicating pregnancy: Secondary | ICD-10-CM | POA: Diagnosis not present

## 2021-03-30 DIAGNOSIS — R030 Elevated blood-pressure reading, without diagnosis of hypertension: Secondary | ICD-10-CM | POA: Diagnosis not present

## 2021-03-30 DIAGNOSIS — R809 Proteinuria, unspecified: Secondary | ICD-10-CM | POA: Diagnosis not present

## 2021-04-05 ENCOUNTER — Other Ambulatory Visit: Payer: Self-pay | Admitting: Obstetrics and Gynecology

## 2021-04-05 DIAGNOSIS — Z3689 Encounter for other specified antenatal screening: Secondary | ICD-10-CM

## 2021-04-06 DIAGNOSIS — Z8751 Personal history of pre-term labor: Secondary | ICD-10-CM | POA: Diagnosis not present

## 2021-04-06 DIAGNOSIS — O24419 Gestational diabetes mellitus in pregnancy, unspecified control: Secondary | ICD-10-CM | POA: Insufficient documentation

## 2021-04-12 ENCOUNTER — Other Ambulatory Visit: Payer: Self-pay | Admitting: Family Medicine

## 2021-04-12 ENCOUNTER — Encounter: Payer: Self-pay | Admitting: Family Medicine

## 2021-04-12 ENCOUNTER — Other Ambulatory Visit: Payer: Self-pay

## 2021-04-12 ENCOUNTER — Ambulatory Visit (INDEPENDENT_AMBULATORY_CARE_PROVIDER_SITE_OTHER): Payer: Medicaid Other | Admitting: Family Medicine

## 2021-04-12 VITALS — BP 101/57 | HR 85 | Temp 97.7°F | Ht 64.0 in | Wt 149.0 lb

## 2021-04-12 DIAGNOSIS — M25559 Pain in unspecified hip: Secondary | ICD-10-CM | POA: Insufficient documentation

## 2021-04-12 DIAGNOSIS — O0930 Supervision of pregnancy with insufficient antenatal care, unspecified trimester: Secondary | ICD-10-CM | POA: Insufficient documentation

## 2021-04-12 DIAGNOSIS — R8290 Unspecified abnormal findings in urine: Secondary | ICD-10-CM

## 2021-04-12 DIAGNOSIS — D571 Sickle-cell disease without crisis: Secondary | ICD-10-CM

## 2021-04-12 DIAGNOSIS — D508 Other iron deficiency anemias: Secondary | ICD-10-CM

## 2021-04-12 DIAGNOSIS — E559 Vitamin D deficiency, unspecified: Secondary | ICD-10-CM

## 2021-04-12 DIAGNOSIS — Z1159 Encounter for screening for other viral diseases: Secondary | ICD-10-CM

## 2021-04-12 DIAGNOSIS — Z3493 Encounter for supervision of normal pregnancy, unspecified, third trimester: Secondary | ICD-10-CM

## 2021-04-12 DIAGNOSIS — R102 Pelvic and perineal pain: Secondary | ICD-10-CM | POA: Insufficient documentation

## 2021-04-12 DIAGNOSIS — D649 Anemia, unspecified: Secondary | ICD-10-CM | POA: Insufficient documentation

## 2021-04-12 LAB — POCT URINALYSIS DIP (CLINITEK)
Bilirubin, UA: NEGATIVE
Blood, UA: NEGATIVE
Glucose, UA: NEGATIVE mg/dL
Ketones, POC UA: NEGATIVE mg/dL
Nitrite, UA: NEGATIVE
Spec Grav, UA: 1.015 (ref 1.010–1.025)
Urobilinogen, UA: 1 E.U./dL
pH, UA: 7 (ref 5.0–8.0)

## 2021-04-12 MED ORDER — ACETAMINOPHEN 325 MG PO TABS: 650.0000 mg | ORAL_TABLET | Freq: Four times a day (QID) | ORAL | 2 refills | Status: DC | PRN

## 2021-04-12 NOTE — Patient Instructions (Addendum)
[facility] [place], [place]   Sickle Cell Anemia, Adult Sickle cell anemia is a condition where your red blood cells are shaped like sickles. Red blood cells carry oxygen through the body. Sickle-shaped cells do not live as long as normal red blood cells. They also clump together and block blood from flowing through the blood vessels. This prevents the body from getting enough oxygen. Sickle cell anemia causes organ damage and pain. It also increases the risk of infection. Follow these instructions at home: Medicines Take over-the-counter and prescription medicines only as told by your doctor. If you were prescribed an antibiotic medicine, take it as told by your doctor. Do not stop taking the antibiotic even if you start to feel better. If you develop a fever, do not take medicines to lower the fever right away. Tell your doctor about the fever. Managing pain, stiffness, and swelling Try these methods to help with pain: Use a heating pad. Take a warm bath. Distract yourself, such as by watching TV. Eating and drinking Drink enough fluid to keep your pee (urine) clear or pale yellow. Drink more in hot weather and during exercise. Limit or avoid alcohol. Eat a healthy diet. Eat plenty of fruits, vegetables, whole grains, and lean protein. Take vitamins and supplements as told by your doctor. Traveling When traveling, keep these with you: Your medical information. The names of your doctors. Your medicines. If you need to take an airplane, talk to your doctor first. Activity Rest often. Avoid exercises that make your heart beat much faster, such as jogging. General instructions Do not use products that have nicotine or tobacco, such as cigarettes and e-cigarettes. If you need help quitting, ask your doctor. Consider wearing a medical alert bracelet. Avoid being in high places (high altitudes), such as mountains. Avoid very hot or  cold temperatures. Avoid places where the temperature changes a lot. Keep all follow-up visits as told by your doctor. This is important. Contact a doctor if: A joint hurts. Your feet or hands hurt or swell. You feel tired (fatigued). Get help right away if: You have symptoms of infection. These include: Fever. Chills. Being very tired. Irritability. Poor eating. Throwing up (vomiting). You feel dizzy or faint. You have new stomach pain, especially on the left side. You have a an erection (priapism) that lasts more than 4 hours. You have numbness in your arms or legs. You have a hard time moving your arms or legs. You have trouble talking. You have pain that does not go away when you take medicine. You are short of breath. You are breathing fast. You have a long-term cough. You have pain in your chest. You have a bad headache. You have a stiff neck. Your stomach looks bloated even though you did not eat much. Your skin is pale. You suddenly cannot see well. Summary Sickle cell anemia is a condition where your red blood cells are shaped like sickles. Follow your doctor's advice on ways to manage pain, food to eat, activities to do, and steps to take for safe travel. Get medical help right away if you have any signs of infection, such as a fever. This information is not intended to replace advice given to you by your health care provider. Make sure you discuss any questions you have with your health care provider. Document Revised: 09/25/2019 Document Reviewed: 09/25/2019 Elsevier Patient Education  [employer].

## 2021-04-12 NOTE — Progress Notes (Signed)
Patient Joy Ferguson   New Patient Office Visit  Subjective:  Patient ID: Joy Ferguson, female    DOB: Oct 10, 1985  Age: 35 y.o. MRN: [id number]  CC:  Chief Complaint  Patient presents with   Establish Ferguson    Patient currently [redacted] week pregnant, she does have sickle cell. She has be having pain in her chest for the past 2 years that comes and goes.     HPI Aleksandra Ollinger is a very pleasant 35 year old female with a medical history significant for sickle cell disease type SS who is currently [redacted] weeks pregnant that presents to establish Ferguson.  Patient states that she was referred by her obstetrician to establish Ferguson for sickle cell.  Patient was previously a patient at [facility] hematology.  She was unable to continue follow-up due to insurance constraints.  Patient states that its been several months since she followed up with [facility] hematology.  Patient currently endorses allover body pain.  She does not typically take pain medications for sickle cell related pain.  She has not attempted any interventions for pain control.  Patient states that she hydrates frequently.  She is requesting referral to hematologist. Patient follows closely with her obstetrician.  She states that she has not had any complications with her pregnancy thus far.  She is up-to-date with all of her ultrasounds.  Patient takes prenatal vitamins consistently.  She does not have any vaginal bleeding, cramping, or abnormal discharge at this time.    Past Medical History:  Diagnosis Date   Anemia    Female circumcision    GERD (gastroesophageal reflux disease)    takes Zantac with relief   Gestational proteinuria    Sickle cell anemia (HCC)    Thrombocytosis    Thyroid nodule     Past Surgical History:  Procedure Laterality Date   VAGINA SURGERY      Family History  Problem Relation Age of Onset   Hypertension Father    Diabetes Paternal Grandmother    Hypertension  Paternal Grandmother     Social History   Socioeconomic History   Marital status: Married    Spouse name: Not on file   Number of children: Not on file   Years of education: Not on file   Highest education level: Not on file  Occupational History   Not on file  Tobacco Use   Smoking status: Never   Smokeless tobacco: Never  Vaping Use   Vaping Use: Never used  Substance and Sexual Activity   Alcohol use: No   Drug use: No   Sexual activity: Yes    Birth control/protection: None  Other Topics Concern   Not on file  Social History Narrative   Not on file   Social Determinants of Health   Financial Resource Strain: Not on file  Food Insecurity: Not on file  Transportation Needs: Not on file  Physical Activity: Not on file  Stress: Not on file  Social Connections: Not on file  Intimate Partner Violence: Not on file    ROS Review of Systems  Constitutional:  Negative for activity change and appetite change.  HENT: Negative.    Respiratory: Negative.    Gastrointestinal: Negative.    Objective:   Today's Vitals: BP (!) 101/57   Pulse 85   Temp 97.7 F (36.5 C)   Ht 5\' 4"  (1.626 m)   Wt 149 lb 0.6 oz (67.6 kg)   SpO2 96%  Breastfeeding No   BMI 25.58 kg/m   Physical Exam Constitutional:      Appearance: Normal appearance.  HENT:     Mouth/Throat:     Mouth: Mucous membranes are moist.     Pharynx: Oropharynx is clear.  Eyes:     Pupils: Pupils are equal, round, and reactive to light.  Cardiovascular:     Rate and Rhythm: Normal rate and regular rhythm.     Pulses: Normal pulses.  Pulmonary:     Effort: Pulmonary effort is normal.  Abdominal:     General: Bowel sounds are normal.  Musculoskeletal:        General: Normal range of motion.  Neurological:     General: No focal deficit present.     Mental Status: She is alert. Mental status is at baseline.  Psychiatric:        Mood and Affect: Mood normal.        Behavior: Behavior normal.         Thought Content: Thought content normal.        Judgment: Judgment normal.    Assessment & Plan:   Problem List Items Addressed This Visit       Other   Sickle cell anemia ([place]) - Primary   Relevant Orders   POCT URINALYSIS DIP (CLINITEK) (Completed)   Other Visit Diagnoses     Abnormal urinalysis       Relevant Orders   Urine Culture       Outpatient Encounter Medications as of 04/12/2021  Medication Sig   acetaminophen (TYLENOL) 325 MG tablet Take 650 mg by mouth every 6 (six) hours as needed for mild pain.   aspirin 81 MG EC tablet Adult Low Dose Aspirin 81 mg tablet,delayed release  Take 1 tablet every day by oral route.   folic acid (FOLVITE) 1 MG tablet Take 4 mg by mouth daily.   hydroxyprogesterone caproate (MAKENA) 250 mg/mL OIL injection Compounded 17P Hydroprogesterone Caproate 250mg /ml  Hydroxyprogesterone Caproate 250mg /ml  1mg  injection   pantoprazole (PROTONIX) 20 MG tablet pantoprazole 20 mg tablet,delayed release  TAKE 1 TABLET BY MOUTH EVERY DAY FOR 30 DAYS   Prenatal Vit-Fe Fumarate-FA (PRENATAL MULTIVITAMIN) TABS tablet Take 1 tablet by mouth daily at 12 noon.   promethazine (PHENERGAN) 6.25 MG/5ML syrup promethazine 6.25 mg/5 mL oral syrup  TAKE 20 ML BY MOUTH EVERY 4 TO 6 HOUR AS NEEDED   [DISCONTINUED] ferrous sulfate 325 (65 FE) MG tablet Take 1 tablet (325 mg total) by mouth daily with breakfast.   [DISCONTINUED] ibuprofen (ADVIL,MOTRIN) 600 MG tablet Take 1 tablet (600 mg total) by mouth every 6 (six) hours as needed for mild pain or moderate pain.   No facility-administered encounter medications on file as of 04/12/2021.   1. Sickle cell disease without crisis [facility]) Will send a referral to hematology for patient to establish Ferguson. Review hemoglobin fractionation as results become available.   Continue folic acid 1 mg daily to prevent aplastic bone marrow crises.   Pulmonary evaluation - Patient denies severe recurrent wheezes, shortness of  breath with exercise, or persistent cough. If these symptoms develop, pulmonary function tests with spirometry will be ordered, and if abnormal, plan on referral to Pulmonology for further evaluation.  Cardiac - Routine screening for pulmonary hypertension is not recommended.  Eye - High risk of proliferative retinopathy. Annual eye exam with retinal exam recommended to patient.  Immunization status -patient is up-to-date with all vaccinations    - folic acid (FOLVITE)  1 MG tablet; Take 4 mg by mouth daily. - POCT URINALYSIS DIP (CLINITEK) - Sickle Cell Panel - Hgb Fractionation Cascade - Ambulatory referral to Hematology / Oncology - acetaminophen (TYLENOL) 325 MG tablet; Take 2 tablets (650 mg total) by mouth every 6 (six) hours as needed for mild pain.  Dispense: 60 tablet; Refill: 2  2. Abnormal urinalysis Urine leukocytes present, follow urine culture - Urine Culture  3. Vitamin D deficiency  - Sickle Cell Panel  4. Need for hepatitis C screening test  - Hepatitis C Antibody  5. Other iron deficiency anemia Reviewed previous labs, IDA. Patient to establish Ferguson with hematology - Ambulatory referral to Hematology / Oncology - Iron, TIBC and Ferritin Panel  6. Third trimester pregnancy - hydroxyprogesterone caproate (MAKENA) 250 mg/mL OIL injection; Compounded 17P Hydroprogesterone Caproate 250mg /ml  Hydroxyprogesterone Caproate 250mg /ml  1mg  injection - aspirin 81 MG EC tablet; Adult Low Dose Aspirin 81 mg tablet,delayed release  Take 1 tablet every day by oral route. - folic acid (FOLVITE) 1 MG tablet; Take 4 mg by mouth daily. - pantoprazole (PROTONIX) 20 MG tablet; pantoprazole 20 mg tablet,delayed release  TAKE 1 TABLET BY MOUTH EVERY DAY FOR 30 DAYS - promethazine (PHENERGAN) 6.25 MG/5ML syrup; promethazine 6.25 mg/5 mL oral syrup  TAKE 20 ML BY MOUTH EVERY 4 TO 6 HOUR AS NEEDED   Follow-up: Return in about 1 month (around 05/12/2021) for sickle cell anemia.     [name]  APRN, MSN, FNP-C [facility] [address] [place], [place] [address] [phone]

## 2021-04-13 DIAGNOSIS — D573 Sickle-cell trait: Secondary | ICD-10-CM | POA: Diagnosis not present

## 2021-04-13 DIAGNOSIS — Z3A31 31 weeks gestation of pregnancy: Secondary | ICD-10-CM | POA: Diagnosis not present

## 2021-04-14 ENCOUNTER — Telehealth: Payer: Self-pay | Admitting: Family Medicine

## 2021-04-14 NOTE — Telephone Encounter (Signed)
Joy Ferguson is a very pleasant 35 year old female with a medical history significant for sickle cell that presented to establish care.  Upon reviewing labs, it is found that vitamin D is slightly decreased at 23.  Recommend that patient continues her daily prenatal vitamins.  Also, recommend vitamin D fortified foods such as yogurt, fatty fish, fortified milk and orange juice are some examples.  Recommend that patient follows up as scheduled. Also, will send a referral to [facility] hematology.   [name]  APRN, MSN, FNP-C [facility] [address] [place], [place] [address] [phone]

## 2021-04-15 ENCOUNTER — Other Ambulatory Visit: Payer: Self-pay

## 2021-04-15 DIAGNOSIS — D571 Sickle-cell disease without crisis: Secondary | ICD-10-CM

## 2021-04-15 LAB — URINE CULTURE

## 2021-04-15 NOTE — Telephone Encounter (Signed)
Tried to contact the patient but both number are not working. Referral was put in for Hematology, please sign it and I will fax it to [facility].

## 2021-04-16 LAB — HGB FRACTIONATION CASCADE: Hgb A2: 3 % (ref 1.8–3.2)

## 2021-04-16 LAB — HEPATITIS C ANTIBODY: Hep C Virus Ab: <0.1 s/co ratio (ref 0.0–0.9)

## 2021-04-16 LAB — CMP14+CBC/D/PLT+FER+RETIC+V...
ALT: 73 IU/L — ABNORMAL HIGH (ref 0–32)
AST: 68 IU/L — ABNORMAL HIGH (ref 0–40)
Albumin/Globulin Ratio: 1.1 — ABNORMAL LOW (ref 1.2–2.2)
Albumin: 3.3 g/dL — ABNORMAL LOW (ref 3.8–4.8)
Alkaline Phosphatase: 142 IU/L — ABNORMAL HIGH (ref 44–121)
BUN/Creatinine Ratio: 11 (ref 9–23)
BUN: 4 mg/dL — ABNORMAL LOW (ref 6–20)
Basophils Absolute: 0.1 10*3/uL (ref 0.0–0.2)
Basos: 1 %
Bilirubin Total: 1.5 mg/dL — ABNORMAL HIGH (ref 0.0–1.2)
CO2: 19 mmol/L — ABNORMAL LOW (ref 20–29)
Calcium: 9 mg/dL (ref 8.7–10.2)
Chloride: 102 mmol/L (ref 96–106)
Creatinine, Ser: 0.35 mg/dL — ABNORMAL LOW (ref 0.57–1.00)
EOS (ABSOLUTE): 0.2 10*3/uL (ref 0.0–0.4)
Eos: 2 %
Ferritin: 701 ng/mL — ABNORMAL HIGH (ref 15–150)
Globulin, Total: 2.9 g/dL (ref 1.5–4.5)
Glucose: 91 mg/dL (ref 70–99)
Hematocrit: 26 % — ABNORMAL LOW (ref 34.0–46.6)
Hemoglobin: 8.8 g/dL — ABNORMAL LOW (ref 11.1–15.9)
Immature Grans (Abs): 0.2 10*3/uL — ABNORMAL HIGH (ref 0.0–0.1)
Immature Granulocytes: 2 %
Lymphocytes Absolute: 3.1 10*3/uL (ref 0.7–3.1)
Lymphs: 31 %
MCH: 31.1 pg (ref 26.6–33.0)
MCHC: 33.8 g/dL (ref 31.5–35.7)
MCV: 92 fL (ref 79–97)
Monocytes Absolute: 0.7 10*3/uL (ref 0.1–0.9)
Monocytes: 7 %
NRBC: 27 % — ABNORMAL HIGH (ref 0–0)
Neutrophils Absolute: 5.7 10*3/uL (ref 1.4–7.0)
Neutrophils: 57 %
Platelets: 498 10*3/uL — ABNORMAL HIGH (ref 150–450)
Potassium: 4.4 mmol/L (ref 3.5–5.2)
RBC: 2.83 x10E6/uL — ABNORMAL LOW (ref 3.77–5.28)
RDW: 17.6 % — ABNORMAL HIGH (ref 11.7–15.4)
Retic Ct Pct: 10.3 % — ABNORMAL HIGH (ref 0.6–2.6)
Sodium: 135 mmol/L (ref 134–144)
Total Protein: 6.2 g/dL (ref 6.0–8.5)
Vit D, 25-Hydroxy: 23.3 ng/mL — ABNORMAL LOW (ref 30.0–100.0)
WBC: 10 10*3/uL (ref 3.4–10.8)
eGFR: 137 mL/min/{1.73_m2} (ref >59)

## 2021-04-16 LAB — HGB FRAC BY HPLC+SOLUBILITY
Hgb A: 0 % — ABNORMAL LOW (ref 96.4–98.8)
Hgb C: 0 %
Hgb E: 0 %
Hgb F: 7.7 % — ABNORMAL HIGH (ref 0.0–2.0)
Hgb S: 89.3 % — ABNORMAL HIGH
Hgb Solubility: POSITIVE — AB
Hgb Variant: 0 %

## 2021-04-16 LAB — IRON,TIBC AND FERRITIN PANEL
Iron Saturation: 79 % (ref 15–55)
Iron: 276 ug/dL (ref 27–159)
Total Iron Binding Capacity: 349 ug/dL (ref 250–450)
UIBC: 73 ug/dL — ABNORMAL LOW (ref 131–425)

## 2021-04-18 ENCOUNTER — Other Ambulatory Visit: Payer: Self-pay

## 2021-04-19 ENCOUNTER — Encounter: Payer: Self-pay | Admitting: *Deleted

## 2021-04-21 DIAGNOSIS — Z8751 Personal history of pre-term labor: Secondary | ICD-10-CM | POA: Diagnosis not present

## 2021-04-22 ENCOUNTER — Other Ambulatory Visit: Payer: Self-pay | Admitting: Obstetrics and Gynecology

## 2021-04-22 ENCOUNTER — Ambulatory Visit (HOSPITAL_BASED_OUTPATIENT_CLINIC_OR_DEPARTMENT_OTHER): Payer: Medicaid Other | Admitting: Obstetrics and Gynecology

## 2021-04-22 ENCOUNTER — Ambulatory Visit: Payer: Medicaid Other | Attending: Obstetrics and Gynecology

## 2021-04-22 ENCOUNTER — Ambulatory Visit: Payer: Medicaid Other | Admitting: *Deleted

## 2021-04-22 ENCOUNTER — Other Ambulatory Visit: Payer: Self-pay

## 2021-04-22 ENCOUNTER — Encounter: Payer: Self-pay | Admitting: *Deleted

## 2021-04-22 VITALS — BP 100/58 | HR 95

## 2021-04-22 DIAGNOSIS — D571 Sickle-cell disease without crisis: Secondary | ICD-10-CM | POA: Diagnosis not present

## 2021-04-22 DIAGNOSIS — O99013 Anemia complicating pregnancy, third trimester: Secondary | ICD-10-CM

## 2021-04-22 DIAGNOSIS — O24419 Gestational diabetes mellitus in pregnancy, unspecified control: Secondary | ICD-10-CM | POA: Diagnosis not present

## 2021-04-22 DIAGNOSIS — Z3689 Encounter for other specified antenatal screening: Secondary | ICD-10-CM | POA: Insufficient documentation

## 2021-04-22 DIAGNOSIS — Z3A33 33 weeks gestation of pregnancy: Secondary | ICD-10-CM | POA: Diagnosis not present

## 2021-04-22 DIAGNOSIS — O09523 Supervision of elderly multigravida, third trimester: Secondary | ICD-10-CM | POA: Insufficient documentation

## 2021-04-22 NOTE — Progress Notes (Addendum)
Maternal-Fetal Medicine   Name: Joy Ferguson DOB: [birth year] MRN: [id number] Referring Provider: Donnel Saxon, CNM  I had the pleasure of [facility]. She is G6 P1132 at 33-weeks' gestation and is here for ultrasound evaluation and consultation. Patient speaks Arabic and is comfortable with [place] and declined interpreter services. Her problems include: -Sickle-Cell Anemia -Gestational diabetes. -History of preterm delivery   Past medical history: Patient reports that sickle cell disease was diagnosed in her first pregnancy in 2016.  She has not had acute pain crisis or acute chest syndrome that required hospitalization.  No history of multiple blood transfusions.  No history of pulmonary embolism. She does not have hypertension.  Past surgical history: Nil of note. Medications: Prenatal vitamins, low-dose aspirin, folic acid, progesterone prophylaxis (weekly 17-OH) Family history: Father died of hypertension complications.  Mother is alive and well.  All his siblings are in good health and none of them has sickle cell disease. Allergies: No known drug allergies. Social history: Denies tobacco or drug or alcohol use. Obstetric history -12/2014: Preterm vaginal delivery at [redacted] weeks gestation at [place] of a female infant weighing 5 pounds at birth.  A few days after delivery the patient was admitted to [facility] with high fever and shortness of breath.  On x-ray chest, pneumonia was confirmed, and the patient received antibiotics.  No blood transfusion was given according to the chart. -03/2018: Term vaginal delivery of a female infant weighing 5 pounds and 5 ounces at birth.  Her son has hereditary spherocytosis and gets blood transfusions frequently. Patient had met with a genetic counselor and was informed of 25% likelihood of having another child affected with hereditary spherocytosis.  Prenatal course: On cell free fetal DNA  screening, the risks of fetal aneuploidies are not increased.  She has gestational diabetes and is just started checking her blood glucose.  She reports fasting levels are under 95 mg/dL and postprandial levels are from 130 to 150 mg/dL. Recent lab work showed AST and ALT are elevated at 57 and 38 respectively.  Serum creatinine is within normal range.  Hemoglobin is 7.9 g/dL.  Urinalysis showed increased proteinuria. Blood pressure today at her office is 100/58 mmHg.  Ultrasound Fetal growth is appropriate for gestational age.  Amniotic fluid is normal and good fetal activity seen.  Head circumference measurement is at between -2 and -1 SD (normal).  Fetal anatomical survey appears normal but limited by advanced gestational age.  Antenatal testing is reassuring.  BPP 8/8.  Our concerns include Sickle cell disease (SCD) Patient has Hb SS disease.  Consider later complications or increased in patient with SCD.  Gestational hypertension/preeclampsia, venous thromboembolism are increased.  Fetal growth restriction and preterm delivery are slightly increased.  Women with SCD or more likely to require blood transfusions and admission to critical care unit.  Fortunately, the patient has not had acute pain crisis requiring hospitalization.  Her past admission following delivery of her first child was for pneumonia that was adequately treated.  In the absence of acute painful crisis, I anticipate lower likelihood of vaso-occlusive crisis in this pregnancy. Thromboprophylaxis is not indicated in the absence of hospitalization or pain crisis.  I encouraged the patient to maintain adequate hydration by drinking enough oral fluids.  Timing of delivery: As preeclampsia and placental insufficiency or increased in patients with SCD, delivery at 81 to [redacted] weeks gestation is reasonable.  Vaginal delivery is recommended, and cesarean section should  be performed only for obstetric indications.  Cesarean delivery  increases the risk of venous thromboembolism.  If cesarean delivery were to be performed, prophylactic anticoagulation may be considered.  Increased liver enzymes and increased proteinuria seen or not consistent with diagnosis of gestational hypertension/preeclampsia.  Liver enzymes can be mildly increased in women with sickle cell disease (ischemia).  I reassured the patient and instructed her to avoid Tylenol.  Patient does not have right upper quadrant pain or severe headache or visual disturbances.  Gestational diabetes I explained the diagnosis of gestational diabetes.  I emphasized the importance of good blood glucose control to prevent adverse fetal or neonatal outcomes.  I discussed blood glucose normal values. I encouraged her to check her blood glucose regularly. Possible complications of gestational diabetes include fetal macrosomia, shoulder dystocia and birth injuries, stillbirth (in poorly controlled diabetes) and neonatal respiratory syndrome and other complications.  Patient does not have side effects to metformin. I encouraged her to continue metformin. If diabetes is not well controlled, insulin may be necessary.  Type 2 diabetes develops in about 25% to 40% of women with GDM. I recommend postpartum screening with 75-g glucose load at 6 to 12 weeks after delivery.  Recommendations -Weekly BPP from now till delivery. -Consider delivery at 55- to 39-weeks' gestation. -Weekly BP monitoring. -Blood transfusion if hemoglobin is 6g/dL or less or if patient has symptoms of severe anemia.  Thank you for consultation.  If you have any questions or concerns, please contact me the [facility].  Consultation including face-to-face (more than 50%) counseling 45 minutes.

## 2021-04-26 ENCOUNTER — Ambulatory Visit: Payer: Medicaid Other | Admitting: Registered"

## 2021-04-26 ENCOUNTER — Other Ambulatory Visit: Payer: Self-pay

## 2021-04-26 ENCOUNTER — Encounter: Payer: Medicaid Other | Attending: Obstetrics and Gynecology | Admitting: Registered"

## 2021-04-26 DIAGNOSIS — O24419 Gestational diabetes mellitus in pregnancy, unspecified control: Secondary | ICD-10-CM | POA: Diagnosis not present

## 2021-04-26 DIAGNOSIS — Z3A00 Weeks of gestation of pregnancy not specified: Secondary | ICD-10-CM | POA: Insufficient documentation

## 2021-04-26 NOTE — Progress Notes (Signed)
Patient was seen for Gestational Diabetes self-management on 04/26/21  Start time 0945 and End time 1015   Estimated due date: 06/10/20; [redacted]w[redacted]d  Clinical: Medications: reviewed Medical History: reviewed Labs: OGTT 120-130-148, A1c n/a%   Dietary and Lifestyle History: Patient states she eats traditional foods including bread type of foods, beans, rice, and pasta. Pt states she enjoys dates with peanuts. Pt states she drinks a glass of juice after dinner.  Physical Activity: ADL Stress: not assessed Sleep: not assessed  24 hr Recall:  First Meal: beans, eggs, juice or water Snack: date with peanuts Second meal: (small meal/snack) bread / soup Snack: fruit Third meal: meat, rice or pasta or bread, vegetable Snack: Beverages: water, juice  NUTRITION INTERVENTION  Nutrition education (E-1) on the following topics:   Initial Follow-up  [x]  []  Definition of Gestational Diabetes [x]  []  Why dietary management is important in controlling blood glucose [x]  []  Effects each nutrient has on blood glucose levels []  []  Simple carbohydrates vs complex carbohydrates []  []  Fluid intake [x]  []  Creating a balanced meal plan [x]  []  Carbohydrate counting  [x]  []  When to check blood glucose levels [x]  []  Proper blood glucose monitoring techniques [x]  []  Effect of stress and stress reduction techniques  [x]  []  Exercise effect on blood glucose levels, appropriate exercise during pregnancy []  []  Importance of limiting caffeine and abstaining from alcohol and smoking [x]  []  Medications used for blood sugar control during pregnancy []  []  Hypoglycemia and rule of 15 []  []  Postpartum self care  Patient already has a meter, is testing pre breakfast and 2 hours after some meals. FBS: 95-98 mg/dL 2 hrs Postprandial: 130-140 avg. Once was 160-something  Patient instructed to monitor glucose levels: FBS: 60 - ? 95 mg/dL (some clinics use 90 for cutoff) 1 hour: ? 140 mg/dL 2 hour: ? 120  mg/dL  Patient received handouts: Nutrition Diabetes and Pregnancy Carbohydrate Counting List  Patient will be seen for follow-up as needed.

## 2021-04-28 ENCOUNTER — Other Ambulatory Visit: Payer: Self-pay | Admitting: Obstetrics and Gynecology

## 2021-04-28 DIAGNOSIS — D571 Sickle-cell disease without crisis: Secondary | ICD-10-CM

## 2021-04-28 DIAGNOSIS — Z3A35 35 weeks gestation of pregnancy: Secondary | ICD-10-CM

## 2021-04-28 DIAGNOSIS — Z3A34 34 weeks gestation of pregnancy: Secondary | ICD-10-CM

## 2021-04-28 DIAGNOSIS — Z3A36 36 weeks gestation of pregnancy: Secondary | ICD-10-CM

## 2021-04-28 DIAGNOSIS — O24410 Gestational diabetes mellitus in pregnancy, diet controlled: Secondary | ICD-10-CM

## 2021-04-28 DIAGNOSIS — O09523 Supervision of elderly multigravida, third trimester: Secondary | ICD-10-CM

## 2021-04-29 ENCOUNTER — Ambulatory Visit: Payer: Medicaid Other | Admitting: *Deleted

## 2021-04-29 ENCOUNTER — Other Ambulatory Visit: Payer: Self-pay

## 2021-04-29 ENCOUNTER — Ambulatory Visit: Payer: Medicaid Other | Attending: Obstetrics and Gynecology

## 2021-04-29 VITALS — BP 102/57 | HR 80

## 2021-04-29 DIAGNOSIS — D571 Sickle-cell disease without crisis: Secondary | ICD-10-CM | POA: Diagnosis not present

## 2021-04-29 DIAGNOSIS — O24410 Gestational diabetes mellitus in pregnancy, diet controlled: Secondary | ICD-10-CM | POA: Diagnosis not present

## 2021-04-29 DIAGNOSIS — Z3A34 34 weeks gestation of pregnancy: Secondary | ICD-10-CM | POA: Insufficient documentation

## 2021-04-29 DIAGNOSIS — O99013 Anemia complicating pregnancy, third trimester: Secondary | ICD-10-CM | POA: Insufficient documentation

## 2021-04-29 DIAGNOSIS — O09523 Supervision of elderly multigravida, third trimester: Secondary | ICD-10-CM

## 2021-05-02 DIAGNOSIS — D5701 Hb-SS disease with acute chest syndrome: Secondary | ICD-10-CM | POA: Diagnosis not present

## 2021-05-02 DIAGNOSIS — Z331 Pregnant state, incidental: Secondary | ICD-10-CM | POA: Diagnosis not present

## 2021-05-02 DIAGNOSIS — N96 Recurrent pregnancy loss: Secondary | ICD-10-CM | POA: Diagnosis not present

## 2021-05-02 DIAGNOSIS — R0789 Other chest pain: Secondary | ICD-10-CM | POA: Diagnosis not present

## 2021-05-02 DIAGNOSIS — D563 Thalassemia minor: Secondary | ICD-10-CM | POA: Diagnosis not present

## 2021-05-04 DIAGNOSIS — Z8751 Personal history of pre-term labor: Secondary | ICD-10-CM | POA: Diagnosis not present

## 2021-05-06 ENCOUNTER — Other Ambulatory Visit: Payer: Self-pay | Admitting: Obstetrics and Gynecology

## 2021-05-06 ENCOUNTER — Ambulatory Visit: Payer: Medicaid Other

## 2021-05-06 ENCOUNTER — Other Ambulatory Visit: Payer: Medicaid Other

## 2021-05-06 DIAGNOSIS — O09523 Supervision of elderly multigravida, third trimester: Secondary | ICD-10-CM

## 2021-05-06 DIAGNOSIS — D571 Sickle-cell disease without crisis: Secondary | ICD-10-CM

## 2021-05-06 DIAGNOSIS — O24410 Gestational diabetes mellitus in pregnancy, diet controlled: Secondary | ICD-10-CM

## 2021-05-06 DIAGNOSIS — Z3A35 35 weeks gestation of pregnancy: Secondary | ICD-10-CM

## 2021-05-06 DIAGNOSIS — O99013 Anemia complicating pregnancy, third trimester: Secondary | ICD-10-CM

## 2021-05-10 ENCOUNTER — Ambulatory Visit (INDEPENDENT_AMBULATORY_CARE_PROVIDER_SITE_OTHER): Payer: Medicaid Other | Admitting: Family Medicine

## 2021-05-10 ENCOUNTER — Encounter: Payer: Self-pay | Admitting: Family Medicine

## 2021-05-10 ENCOUNTER — Other Ambulatory Visit: Payer: Self-pay

## 2021-05-10 VITALS — BP 100/56 | HR 80 | Temp 97.7°F | Ht 64.0 in | Wt 153.0 lb

## 2021-05-10 DIAGNOSIS — D571 Sickle-cell disease without crisis: Secondary | ICD-10-CM | POA: Diagnosis not present

## 2021-05-10 DIAGNOSIS — E559 Vitamin D deficiency, unspecified: Secondary | ICD-10-CM | POA: Diagnosis not present

## 2021-05-10 LAB — POCT URINALYSIS DIP (CLINITEK)
Bilirubin, UA: NEGATIVE
Glucose, UA: NEGATIVE mg/dL
Ketones, POC UA: NEGATIVE mg/dL
Leukocytes, UA: NEGATIVE
Nitrite, UA: NEGATIVE
Spec Grav, UA: 1.025 (ref 1.010–1.025)
Urobilinogen, UA: 0.2 E.U./dL
pH, UA: 5.5 (ref 5.0–8.0)

## 2021-05-10 MED ORDER — VITAMIN D3 50 MCG (2000 UT) PO CAPS: 2000.0000 [IU] | ORAL_CAPSULE | Freq: Every day | ORAL | 11 refills | Status: DC

## 2021-05-10 NOTE — Patient Instructions (Signed)
Helicobacter Pylori Antibodies Test Why am I having this test? This test is used to check for a type of bacteria called Helicobacter pylori (H. pylori). H. pylori can be found in the cells that line the stomach. Having high levels of H. pylori in your stomach puts you at risk for: Stomach ulcers and small bowel ulcers. Long-term (chronic) inflammation of the lining of the stomach. Ulcers in the part of the body that moves food from the mouth to the stomach (esophagus). Stomach cancer if the infection is not treated. Most people with H. pylori in their stomach have no symptoms. Your health care provider may ask you to have this test if you have symptoms of a stomach ulcer or small bowel ulcer, such as stomach pain before or after eating, heartburn, or nausea after eating. What is being tested? This test checks your blood for antibodies to the H. pylori bacteria. Antibodies are proteins made by your immune system to fight germs and infection. The test checks for antibodies that the immune system produces in response to infection with H. pylori. What kind of sample is taken? A blood sample is required for this test. It is usually collected by inserting a needle into a blood vessel or by sticking a finger with a small needle. Tell a health care provider about: All medicines you are taking, including vitamins, herbs, eye drops, creams, and over-the-counter medicines. How are the results reported? Your test results will be reported as values that are categorized as positive, negative, or equivocal. Equivocal means that your results are neither positive nor negative. Your health care provider will compare your results to normal ranges that were established after testing a large group of people (reference ranges). Reference ranges may vary among labs and hospitals. For this test, common reference ranges for the two types of antibodies that may be tested are: IgG antibodies: Less than 0.75 units/mL. This is  negative. 1 unit/mL or greater. This is positive. 0.75-0.99 units/mL. This is equivocal. IgM antibodies: 30 units/mL or less. This is negative. 40 units/mL or greater. This is positive. 30.01-39.99 units/mL. This is equivocal. What do the results mean? Test results that are higher than normal, or positive, may indicate various health conditions, such as: Short-term or long-term irritation of the stomach lining (gastritis). Small bowel ulcer. Stomach ulcer. Stomach cancer. Talk with your health care provider about what your results mean. Questions to ask your health care provider Ask your health care provider, or the department that is doing the test: When will my results be ready? How will I get my results? What are my treatment options? What other tests do I need? What are my next steps? Summary This test is used to check for a type of bacteria called Helicobacter pylori (H. pylori). Having high levels of H. pylori in your stomach puts you at risk for ulcers in the gastrointestinal tract or stomach cancer. Most people with H. pylori in their stomach have no symptoms. Your health care provider may ask you to have this test if you have symptoms of a stomach ulcer or small bowel ulcer, such as stomach pain before or after eating, heartburn, or nausea after eating. This test checks your blood for antibodies to the H. pylori bacteria. Talk with your health care provider about what your results mean. This information is not intended to replace advice given to you by your health care provider. Make sure you discuss any questions you have with your health care provider. Document Revised: 11/17/2020 Document  Reviewed: 11/17/2020 Elsevier Patient Education  2022 [employer].

## 2021-05-10 NOTE — Progress Notes (Signed)
[facility]   Established Patient Office Visit  Subjective:  Patient ID: Joy Ferguson, female    DOB: March 07, 1986  Age: 35 y.o. MRN: [id number]  CC:  Chief Complaint  Patient presents with   Follow-up    Pt is here today for her 1 month follow up.  No concerns or issues to discuss     HPI Joy Ferguson is a 35 year old female with a medical history significant for sickle cell disease type SS and vitamin D deficiency that presents for a 1 month follow-up after establishing care.  Patient is around [redacted] weeks pregnant.  She is scheduled to be induced on May 27, 2021.  Patient has been following up consistently with both hematology and obstetrics.  Patient states that she is feeling well today and is without complaint.  She most recently had labs on 05/02/2021 when establishing care.  [facility].  At that time, her vitamin D level was around 17.  Patient is not on vitamin D supplement at this time.  Patient states that she complained of epigastric pain with hematology team.  H. pylori was obtained at that time, which was positive.  Protonix was sent in for this patient by hematology.  Patient has not started medication and was unaware.  Today, she denies any headache, dizziness, shortness of breath, chest pain, urinary symptoms, nausea, vomiting, or diarrhea.  She states that she has good fetal movement, no discharge, bleeding, or cramping today.  Patient is up-to-date with all vaccinations.  Past Medical History:  Diagnosis Date   Anemia    Female circumcision    GERD (gastroesophageal reflux disease)    takes Zantac with relief   Gestational proteinuria    Sickle cell anemia (HCC)    Thrombocytosis    Thyroid nodule     Past Surgical History:  Procedure Laterality Date   VAGINA SURGERY      Family History  Problem Relation Age of Onset   Hypertension Father    Diabetes Paternal Grandmother    Hypertension Paternal  Grandmother     Social History   Socioeconomic History   Marital status: Married    Spouse name: Not on file   Number of children: Not on file   Years of education: Not on file   Highest education level: Not on file  Occupational History   Not on file  Tobacco Use   Smoking status: Never   Smokeless tobacco: Never  Vaping Use   Vaping Use: Never used  Substance and Sexual Activity   Alcohol use: No   Drug use: No   Sexual activity: Yes    Birth control/protection: None  Other Topics Concern   Not on file  Social History Narrative   Not on file   Social Determinants of Health   Financial Resource Strain: Not on file  Food Insecurity: Not on file  Transportation Needs: Not on file  Physical Activity: Not on file  Stress: Not on file  Social Connections: Not on file  Intimate Partner Violence: Not on file    Outpatient Medications Prior to Visit  Medication Sig Dispense Refill   aspirin 81 MG EC tablet Adult Low Dose Aspirin 81 mg tablet,delayed release  Take 1 tablet every day by oral route.     folic acid (FOLVITE) 1 MG tablet Take 4 mg by mouth daily.     hydroxyprogesterone caproate (MAKENA) 250 mg/mL OIL injection Compounded 17P Hydroprogesterone Caproate 271m/ml  Hydroxyprogesterone Caproate 213m/ml  155minjection     Prenatal Vit-Fe Fumarate-FA (PRENATAL MULTIVITAMIN) TABS tablet Take 1 tablet by mouth daily at 12 noon.     No facility-administered medications prior to visit.    No Known Allergies  ROS Review of Systems  Constitutional: Negative.   HENT: Negative.    Eyes: Negative.   Respiratory: Negative.    Cardiovascular: Negative.   Endocrine: Negative.   Genitourinary: Negative.   Musculoskeletal:  Positive for arthralgias.  Neurological: Negative.   Psychiatric/Behavioral: Negative.       Objective:    Physical Exam Constitutional:      Appearance: Normal appearance.  Eyes:     Pupils: Pupils are equal, round, and reactive to  light.  Cardiovascular:     Rate and Rhythm: Normal rate and regular rhythm.     Pulses: Normal pulses.  Pulmonary:     Effort: Pulmonary effort is normal.  Abdominal:     General: Bowel sounds are normal.  Musculoskeletal:        General: Normal range of motion.  Skin:    General: Skin is warm.  Neurological:     General: No focal deficit present.     Mental Status: She is alert. Mental status is at baseline.  Psychiatric:        Mood and Affect: Mood normal.        Thought Content: Thought content normal.        Judgment: Judgment normal.    BP (!) 100/56    Pulse 80    Temp 97.7 F (36.5 C)    Ht 5' 4" (1.626 m)    Wt 153 lb (69.4 kg)    LMP 09/15/2020    SpO2 100%    BMI 26.26 kg/m  Wt Readings from Last 3 Encounters:  05/10/21 153 lb (69.4 kg)  04/12/21 149 lb 0.6 oz (67.6 kg)  04/12/18 150 lb (68 kg)     Health Maintenance Due  Topic Date Due   COVID-19 Vaccine (1) Never done   URINE MICROALBUMIN  Never done   PAP SMEAR-Modifier  Never done   Pneumococcal Vaccine 1928450ears old (2 - PPSV23 if available, else PCV20) 11/03/2008    There are no preventive care reminders to display for this patient.  Lab Results  Component Value Date   TSH 0.741 01/14/2015   Lab Results  Component Value Date   WBC 10.0 04/12/2021   HGB 8.8 (L) 04/12/2021   HCT 26.0 (L) 04/12/2021   MCV 92 04/12/2021   PLT 498 (H) 04/12/2021   Lab Results  Component Value Date   NA 135 04/12/2021   K 4.4 04/12/2021   CHLORIDE 103 09/25/2014   CO2 19 (L) 04/12/2021   GLUCOSE 91 04/12/2021   BUN 4 (L) 04/12/2021   CREATININE 0.35 (L) 04/12/2021   BILITOT 1.5 (H) 04/12/2021   ALKPHOS 142 (H) 04/12/2021   AST 68 (H) 04/12/2021   ALT 73 (H) 04/12/2021   PROT 6.2 04/12/2021   ALBUMIN 3.3 (L) 04/12/2021   CALCIUM 9.0 04/12/2021   ANIONGAP 6 01/16/2015   EGFR 137 04/12/2021   No results found for: CHOL No results found for: HDL No results found for: LDLCALC No results found for:  TRIG No results found for: CHOLHDL No results found for: HGBA1C    Assessment & Plan:   Problem List Items Addressed This Visit       Other   Vitamin D deficiency - Primary  Relevant Medications   Cholecalciferol (VITAMIN D3) 50 MCG (2000 UT) capsule   Sickle cell anemia (HCC)   Relevant Orders   Urinalysis Dipstick   1. Vitamin D deficiency  - Cholecalciferol (VITAMIN D3) 50 MCG (2000 UT) capsule; Take 1 capsule (2,000 Units total) by mouth daily.  Dispense: 30 capsule; Refill: 11  2. Sickle cell disease without crisis ([place])  - POCT URINALYSIS DIP (CLINITEK)  Follow-up: Return in about 6 months (around 11/08/2021).     [name]  APRN, MSN, FNP-C [facility] [address] [place], [place] [address] [phone]

## 2021-05-11 ENCOUNTER — Ambulatory Visit: Payer: Medicaid Other | Admitting: *Deleted

## 2021-05-11 ENCOUNTER — Ambulatory Visit: Payer: Medicaid Other | Attending: Obstetrics and Gynecology | Admitting: *Deleted

## 2021-05-11 VITALS — BP 101/55 | HR 81

## 2021-05-11 DIAGNOSIS — O09523 Supervision of elderly multigravida, third trimester: Secondary | ICD-10-CM

## 2021-05-11 DIAGNOSIS — O24419 Gestational diabetes mellitus in pregnancy, unspecified control: Secondary | ICD-10-CM | POA: Insufficient documentation

## 2021-05-11 DIAGNOSIS — Z3A35 35 weeks gestation of pregnancy: Secondary | ICD-10-CM | POA: Diagnosis not present

## 2021-05-11 NOTE — Procedures (Signed)
Joy Ferguson Jul 21, 1985 [redacted]w[redacted]d  Fetus A Non-Stress Test Interpretation for 05/11/21  Indication: Diabetes, AMA  Fetal Heart Rate A Mode: External Baseline Rate (A): 140 bpm Variability: Minimal, Moderate Accelerations: 15 x 15 Decelerations: None Multiple birth?: No  Uterine Activity Mode: Palpation, Toco Contraction Frequency (min): 2 uc's Contraction Duration (sec): 50-80 Contraction Quality: Mild Resting Tone Palpated: Relaxed Resting Time: Adequate  Interpretation (Fetal Testing) Nonstress Test Interpretation: Reactive Overall Impression: Reassuring for gestational age Comments: Dr. Gertie Exon reviewed tracing.

## 2021-05-12 DIAGNOSIS — Z113 Encounter for screening for infections with a predominantly sexual mode of transmission: Secondary | ICD-10-CM | POA: Diagnosis not present

## 2021-05-12 DIAGNOSIS — E559 Vitamin D deficiency, unspecified: Secondary | ICD-10-CM | POA: Diagnosis not present

## 2021-05-12 DIAGNOSIS — Z3403 Encounter for supervision of normal first pregnancy, third trimester: Secondary | ICD-10-CM | POA: Diagnosis not present

## 2021-05-12 DIAGNOSIS — D571 Sickle-cell disease without crisis: Secondary | ICD-10-CM | POA: Diagnosis not present

## 2021-05-12 LAB — OB RESULTS CONSOLE GBS: GBS: NEGATIVE

## 2021-05-20 ENCOUNTER — Other Ambulatory Visit: Payer: Self-pay

## 2021-05-20 ENCOUNTER — Other Ambulatory Visit: Payer: Self-pay | Admitting: Obstetrics & Gynecology

## 2021-05-20 ENCOUNTER — Ambulatory Visit: Payer: BC Managed Care – PPO | Admitting: *Deleted

## 2021-05-20 ENCOUNTER — Encounter: Payer: Self-pay | Admitting: *Deleted

## 2021-05-20 ENCOUNTER — Ambulatory Visit: Payer: BC Managed Care – PPO | Attending: Obstetrics and Gynecology

## 2021-05-20 VITALS — BP 107/60 | HR 81

## 2021-05-20 DIAGNOSIS — O09523 Supervision of elderly multigravida, third trimester: Secondary | ICD-10-CM | POA: Diagnosis not present

## 2021-05-20 DIAGNOSIS — O24410 Gestational diabetes mellitus in pregnancy, diet controlled: Secondary | ICD-10-CM | POA: Diagnosis not present

## 2021-05-20 DIAGNOSIS — O99013 Anemia complicating pregnancy, third trimester: Secondary | ICD-10-CM | POA: Diagnosis not present

## 2021-05-20 DIAGNOSIS — Z3A36 36 weeks gestation of pregnancy: Secondary | ICD-10-CM | POA: Diagnosis not present

## 2021-05-20 DIAGNOSIS — D571 Sickle-cell disease without crisis: Secondary | ICD-10-CM | POA: Diagnosis not present

## 2021-05-24 ENCOUNTER — Encounter: Payer: Self-pay | Admitting: *Deleted

## 2021-05-24 ENCOUNTER — Ambulatory Visit (HOSPITAL_BASED_OUTPATIENT_CLINIC_OR_DEPARTMENT_OTHER): Payer: BC Managed Care – PPO

## 2021-05-24 ENCOUNTER — Other Ambulatory Visit: Payer: Self-pay

## 2021-05-24 ENCOUNTER — Ambulatory Visit: Payer: BC Managed Care – PPO | Attending: Obstetrics and Gynecology | Admitting: *Deleted

## 2021-05-24 VITALS — BP 108/62 | HR 84

## 2021-05-24 DIAGNOSIS — Z3A37 37 weeks gestation of pregnancy: Secondary | ICD-10-CM | POA: Diagnosis not present

## 2021-05-24 DIAGNOSIS — O09523 Supervision of elderly multigravida, third trimester: Secondary | ICD-10-CM | POA: Diagnosis not present

## 2021-05-24 DIAGNOSIS — O24410 Gestational diabetes mellitus in pregnancy, diet controlled: Secondary | ICD-10-CM

## 2021-05-24 DIAGNOSIS — D571 Sickle-cell disease without crisis: Secondary | ICD-10-CM | POA: Insufficient documentation

## 2021-05-24 DIAGNOSIS — O99013 Anemia complicating pregnancy, third trimester: Secondary | ICD-10-CM | POA: Diagnosis not present

## 2021-05-25 ENCOUNTER — Ambulatory Visit: Payer: Medicaid Other

## 2021-05-26 ENCOUNTER — Telehealth (HOSPITAL_COMMUNITY): Payer: Self-pay | Admitting: *Deleted

## 2021-05-26 NOTE — Telephone Encounter (Signed)
Preadmission screen  

## 2021-05-30 ENCOUNTER — Telehealth (HOSPITAL_COMMUNITY): Payer: Self-pay | Admitting: *Deleted

## 2021-05-30 NOTE — Telephone Encounter (Signed)
Preadmission screen  

## 2021-05-31 ENCOUNTER — Telehealth (HOSPITAL_COMMUNITY): Payer: Self-pay | Admitting: *Deleted

## 2021-05-31 NOTE — Telephone Encounter (Signed)
Preadmission screen  

## 2021-06-03 ENCOUNTER — Inpatient Hospital Stay (HOSPITAL_COMMUNITY): Payer: BC Managed Care – PPO | Admitting: Anesthesiology

## 2021-06-03 ENCOUNTER — Inpatient Hospital Stay (HOSPITAL_COMMUNITY): Payer: BC Managed Care – PPO

## 2021-06-03 ENCOUNTER — Other Ambulatory Visit: Payer: Self-pay

## 2021-06-03 ENCOUNTER — Inpatient Hospital Stay (HOSPITAL_COMMUNITY)
Admission: AD | Admit: 2021-06-03 | Discharge: 2021-06-04 | DRG: 784 | Disposition: A | Discharge status: Home / Self Care | Payer: BC Managed Care – PPO | Attending: Obstetrics & Gynecology | Admitting: Obstetrics & Gynecology

## 2021-06-03 ENCOUNTER — Encounter (HOSPITAL_COMMUNITY)
Admission: AD | Disposition: A | Discharge status: Home / Self Care | Payer: Self-pay | Source: Home / Self Care | Attending: Obstetrics & Gynecology

## 2021-06-03 ENCOUNTER — Encounter (HOSPITAL_COMMUNITY): Payer: Self-pay | Admitting: Obstetrics & Gynecology

## 2021-06-03 DIAGNOSIS — Z20822 Contact with and (suspected) exposure to covid-19: Secondary | ICD-10-CM | POA: Diagnosis present

## 2021-06-03 DIAGNOSIS — Z3490 Encounter for supervision of normal pregnancy, unspecified, unspecified trimester: Secondary | ICD-10-CM | POA: Diagnosis present

## 2021-06-03 DIAGNOSIS — Z7982 Long term (current) use of aspirin: Secondary | ICD-10-CM | POA: Diagnosis not present

## 2021-06-03 DIAGNOSIS — Z3A39 39 weeks gestation of pregnancy: Secondary | ICD-10-CM

## 2021-06-03 DIAGNOSIS — N9081 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, unspecified: Secondary | ICD-10-CM | POA: Diagnosis present

## 2021-06-03 DIAGNOSIS — Z302 Encounter for sterilization: Secondary | ICD-10-CM

## 2021-06-03 DIAGNOSIS — O3483 Maternal care for other abnormalities of pelvic organs, third trimester: Secondary | ICD-10-CM | POA: Diagnosis present

## 2021-06-03 DIAGNOSIS — D571 Sickle-cell disease without crisis: Secondary | ICD-10-CM | POA: Diagnosis present

## 2021-06-03 DIAGNOSIS — O9081 Anemia of the puerperium: Secondary | ICD-10-CM | POA: Diagnosis not present

## 2021-06-03 DIAGNOSIS — O99013 Anemia complicating pregnancy, third trimester: Secondary | ICD-10-CM | POA: Diagnosis not present

## 2021-06-03 DIAGNOSIS — O24420 Gestational diabetes mellitus in childbirth, diet controlled: Principal | ICD-10-CM | POA: Diagnosis present

## 2021-06-03 DIAGNOSIS — Z98891 History of uterine scar from previous surgery: Secondary | ICD-10-CM

## 2021-06-03 DIAGNOSIS — D62 Acute posthemorrhagic anemia: Secondary | ICD-10-CM | POA: Diagnosis not present

## 2021-06-03 DIAGNOSIS — D573 Sickle-cell trait: Secondary | ICD-10-CM | POA: Diagnosis not present

## 2021-06-03 LAB — CBC
HCT: 21.8 % — ABNORMAL LOW (ref 36.0–46.0)
HCT: 23.8 % — ABNORMAL LOW (ref 36.0–46.0)
Hemoglobin: 7.6 g/dL — ABNORMAL LOW (ref 12.0–15.0)
Hemoglobin: 8.3 g/dL — ABNORMAL LOW (ref 12.0–15.0)
MCH: 32.2 pg (ref 26.0–34.0)
MCH: 31.2 pg (ref 26.0–34.0)
MCHC: 34.9 g/dL (ref 30.0–36.0)
MCHC: 34.9 g/dL (ref 30.0–36.0)
MCV: 92.4 fL (ref 80.0–100.0)
MCV: 89.5 fL (ref 80.0–100.0)
Platelets: 352 10*3/uL (ref 150–400)
Platelets: 279 10*3/uL (ref 150–400)
RBC: 2.36 MIL/uL — ABNORMAL LOW (ref 3.87–5.11)
RBC: 2.66 MIL/uL — ABNORMAL LOW (ref 3.87–5.11)
RDW: 23.6 % — ABNORMAL HIGH (ref 11.5–15.5)
RDW: 20.8 % — ABNORMAL HIGH (ref 11.5–15.5)
WBC: 11.1 10*3/uL — ABNORMAL HIGH (ref 4.0–10.5)
WBC: 15.4 10*3/uL — ABNORMAL HIGH (ref 4.0–10.5)
nRBC: 66.2 % — ABNORMAL HIGH (ref 0.0–0.2)
nRBC: 34.8 % — ABNORMAL HIGH (ref 0.0–0.2)

## 2021-06-03 LAB — RESP PANEL BY RT-PCR (FLU A&B, COVID) ARPGX2
Influenza A by PCR: NEGATIVE
Influenza B by PCR: NEGATIVE
SARS Coronavirus 2 by RT PCR: NEGATIVE

## 2021-06-03 LAB — RPR: RPR Ser Ql: NONREACTIVE

## 2021-06-03 LAB — GLUCOSE, CAPILLARY
Glucose-Capillary: 95 mg/dL (ref 70–99)
Glucose-Capillary: 122 mg/dL — ABNORMAL HIGH (ref 70–99)

## 2021-06-03 LAB — KLEIHAUER-BETKE STAIN
Fetal Cells %: 0 %
Quantitation Fetal Hemoglobin: 0 mL

## 2021-06-03 LAB — PREPARE RBC (CROSSMATCH)

## 2021-06-03 SURGERY — Surgical Case
Anesthesia: Spinal

## 2021-06-03 MED ORDER — PHENYLEPHRINE 40 MCG/ML (10ML) SYRINGE FOR IV PUSH (FOR BLOOD PRESSURE SUPPORT)
PREFILLED_SYRINGE | INTRAVENOUS | Status: AC
  Filled 2021-06-03: qty 10

## 2021-06-03 MED ORDER — SENNOSIDES-DOCUSATE SODIUM 8.6-50 MG PO TABS
2.0000 | ORAL_TABLET | Freq: Every day | ORAL | Status: DC
  Administered 2021-06-04: 2 via ORAL
  Filled 2021-06-03: qty 2

## 2021-06-03 MED ORDER — FENTANYL CITRATE (PF) 100 MCG/2ML IJ SOLN: 25.0000 ug | INTRAMUSCULAR | Status: DC | PRN

## 2021-06-03 MED ORDER — OXYCODONE-ACETAMINOPHEN 5-325 MG PO TABS: 2.0000 | ORAL_TABLET | ORAL | Status: DC | PRN

## 2021-06-03 MED ORDER — CEFAZOLIN SODIUM-DEXTROSE 2-3 GM-%(50ML) IV SOLR
INTRAVENOUS | Status: DC | PRN
Start: 2021-06-03 — End: 2021-06-03
  Administered 2021-06-03: 2 g via INTRAVENOUS

## 2021-06-03 MED ORDER — TRANEXAMIC ACID-NACL 1000-0.7 MG/100ML-% IV SOLN
INTRAVENOUS | Status: AC
  Filled 2021-06-03: qty 100

## 2021-06-03 MED ORDER — PHENYLEPHRINE HCL (PRESSORS) 10 MG/ML IV SOLN
INTRAVENOUS | Status: DC | PRN
Start: 2021-06-03 — End: 2021-06-03
  Administered 2021-06-03 (×4): 80 ug via INTRAVENOUS

## 2021-06-03 MED ORDER — DIBUCAINE (PERIANAL) 1 % EX OINT: 1.0000 "application " | TOPICAL_OINTMENT | CUTANEOUS | Status: DC | PRN

## 2021-06-03 MED ORDER — SODIUM CHLORIDE 0.9 % IV SOLN: INTRAVENOUS | Status: DC | PRN

## 2021-06-03 MED ORDER — BUPIVACAINE IN DEXTROSE 0.75-8.25 % IT SOLN
INTRATHECAL | Status: DC | PRN
  Administered 2021-06-03: 1.6 mL via INTRATHECAL

## 2021-06-03 MED ORDER — OXYCODONE-ACETAMINOPHEN 5-325 MG PO TABS: 1.0000 | ORAL_TABLET | ORAL | Status: DC | PRN

## 2021-06-03 MED ORDER — ONDANSETRON HCL 4 MG/2ML IJ SOLN: 4.0000 mg | Freq: Four times a day (QID) | INTRAMUSCULAR | Status: DC | PRN

## 2021-06-03 MED ORDER — OXYTOCIN-SODIUM CHLORIDE 30-0.9 UT/500ML-% IV SOLN: 2.5000 [IU]/h | INTRAVENOUS | Status: DC

## 2021-06-03 MED ORDER — LIDOCAINE HCL (PF) 1 % IJ SOLN: 30.0000 mL | INTRAMUSCULAR | Status: DC | PRN

## 2021-06-03 MED ORDER — PRENATAL MULTIVITAMIN CH
1.0000 | ORAL_TABLET | Freq: Every day | ORAL | Status: DC
  Administered 2021-06-03 – 2021-06-04 (×2): 1 via ORAL
  Filled 2021-06-03 (×2): qty 1

## 2021-06-03 MED ORDER — TRANEXAMIC ACID-NACL 1000-0.7 MG/100ML-% IV SOLN
INTRAVENOUS | Status: DC | PRN
  Administered 2021-06-03: 1000 mg via INTRAVENOUS

## 2021-06-03 MED ORDER — SIMETHICONE 80 MG PO CHEW
80.0000 mg | CHEWABLE_TABLET | ORAL | Status: DC | PRN
  Administered 2021-06-04: 80 mg via ORAL
  Filled 2021-06-03: qty 1

## 2021-06-03 MED ORDER — TERBUTALINE SULFATE 1 MG/ML IJ SOLN: 0.2500 mg | Freq: Once | INTRAMUSCULAR | Status: DC | PRN

## 2021-06-03 MED ORDER — FENTANYL CITRATE (PF) 100 MCG/2ML IJ SOLN
INTRAMUSCULAR | Status: DC | PRN
  Administered 2021-06-03: 15 ug via INTRATHECAL

## 2021-06-03 MED ORDER — CEFAZOLIN SODIUM-DEXTROSE 2-4 GM/100ML-% IV SOLN
INTRAVENOUS | Status: AC
  Filled 2021-06-03: qty 100

## 2021-06-03 MED ORDER — OXYTOCIN-SODIUM CHLORIDE 30-0.9 UT/500ML-% IV SOLN
2.5000 [IU]/h | INTRAVENOUS | Status: AC
  Administered 2021-06-03: 2.5 [IU]/h via INTRAVENOUS
  Filled 2021-06-03: qty 500

## 2021-06-03 MED ORDER — MORPHINE SULFATE (PF) 0.5 MG/ML IJ SOLN
INTRAMUSCULAR | Status: AC
  Filled 2021-06-03: qty 10

## 2021-06-03 MED ORDER — SOD CITRATE-CITRIC ACID 500-334 MG/5ML PO SOLN
30.0000 mL | ORAL | Status: DC | PRN
  Administered 2021-06-03: 30 mL via ORAL
  Filled 2021-06-03: qty 30

## 2021-06-03 MED ORDER — NALOXONE HCL 0.4 MG/ML IJ SOLN: 0.4000 mg | INTRAMUSCULAR | Status: DC | PRN

## 2021-06-03 MED ORDER — MEPERIDINE HCL 25 MG/ML IJ SOLN: 6.2500 mg | INTRAMUSCULAR | Status: DC | PRN

## 2021-06-03 MED ORDER — ACETAMINOPHEN 10 MG/ML IV SOLN
INTRAVENOUS | Status: DC | PRN
  Administered 2021-06-03: 1000 mg via INTRAVENOUS

## 2021-06-03 MED ORDER — DIPHENHYDRAMINE HCL 25 MG PO CAPS: 25.0000 mg | ORAL_CAPSULE | Freq: Four times a day (QID) | ORAL | Status: DC | PRN

## 2021-06-03 MED ORDER — DEXAMETHASONE SODIUM PHOSPHATE 4 MG/ML IJ SOLN
INTRAMUSCULAR | Status: DC | PRN
  Administered 2021-06-03: 4 mg via INTRAVENOUS

## 2021-06-03 MED ORDER — OXYTOCIN-SODIUM CHLORIDE 30-0.9 UT/500ML-% IV SOLN
INTRAVENOUS | Status: AC
  Filled 2021-06-03: qty 500

## 2021-06-03 MED ORDER — ACETAMINOPHEN 325 MG PO TABS: 650.0000 mg | ORAL_TABLET | ORAL | Status: DC | PRN

## 2021-06-03 MED ORDER — ACETAMINOPHEN 10 MG/ML IV SOLN
INTRAVENOUS | Status: AC
  Filled 2021-06-03: qty 100

## 2021-06-03 MED ORDER — OXYTOCIN BOLUS FROM INFUSION: 333.0000 mL | Freq: Once | INTRAVENOUS | Status: DC

## 2021-06-03 MED ORDER — SOD CITRATE-CITRIC ACID 500-334 MG/5ML PO SOLN: 30.0000 mL | ORAL | Status: DC

## 2021-06-03 MED ORDER — PHENYLEPHRINE HCL-NACL 20-0.9 MG/250ML-% IV SOLN
INTRAVENOUS | Status: AC
  Filled 2021-06-03: qty 250

## 2021-06-03 MED ORDER — IBUPROFEN 600 MG PO TABS
600.0000 mg | ORAL_TABLET | Freq: Four times a day (QID) | ORAL | Status: DC
  Administered 2021-06-04 (×2): 600 mg via ORAL
  Filled 2021-06-03 (×2): qty 1

## 2021-06-03 MED ORDER — FENTANYL CITRATE (PF) 100 MCG/2ML IJ SOLN: 50.0000 ug | INTRAMUSCULAR | Status: DC | PRN

## 2021-06-03 MED ORDER — KETOROLAC TROMETHAMINE 30 MG/ML IJ SOLN
30.0000 mg | Freq: Four times a day (QID) | INTRAMUSCULAR | Status: AC
  Administered 2021-06-03 (×4): 30 mg via INTRAVENOUS
  Filled 2021-06-03 (×3): qty 1

## 2021-06-03 MED ORDER — HYDROMORPHONE HCL 1 MG/ML IJ SOLN: 0.2000 mg | INTRAMUSCULAR | Status: DC | PRN

## 2021-06-03 MED ORDER — DEXMEDETOMIDINE (PRECEDEX) IN NS 20 MCG/5ML (4 MCG/ML) IV SYRINGE
PREFILLED_SYRINGE | INTRAVENOUS | Status: DC | PRN
  Administered 2021-06-03 (×2): 8 ug via INTRAVENOUS

## 2021-06-03 MED ORDER — OXYCODONE HCL 5 MG PO TABS
5.0000 mg | ORAL_TABLET | ORAL | Status: DC | PRN
  Administered 2021-06-04: 10 mg via ORAL
  Filled 2021-06-03: qty 2

## 2021-06-03 MED ORDER — SODIUM CHLORIDE 0.9% IV SOLUTION: Freq: Once | INTRAVENOUS | Status: DC

## 2021-06-03 MED ORDER — MENTHOL 3 MG MT LOZG: 1.0000 | LOZENGE | OROMUCOSAL | Status: DC | PRN

## 2021-06-03 MED ORDER — LACTATED RINGERS IV SOLN: INTRAVENOUS | Status: DC | PRN

## 2021-06-03 MED ORDER — DEXAMETHASONE SODIUM PHOSPHATE 4 MG/ML IJ SOLN
INTRAMUSCULAR | Status: AC
  Filled 2021-06-03: qty 1

## 2021-06-03 MED ORDER — MORPHINE SULFATE (PF) 0.5 MG/ML IJ SOLN
INTRAMUSCULAR | Status: DC | PRN
  Administered 2021-06-03: .15 mg via INTRATHECAL

## 2021-06-03 MED ORDER — CEFAZOLIN SODIUM-DEXTROSE 2-4 GM/100ML-% IV SOLN: 2.0000 g | Freq: Once | INTRAVENOUS | Status: DC

## 2021-06-03 MED ORDER — WITCH HAZEL-GLYCERIN EX PADS: 1.0000 "application " | MEDICATED_PAD | CUTANEOUS | Status: DC | PRN

## 2021-06-03 MED ORDER — NALOXONE HCL 4 MG/10ML IJ SOLN
1.0000 ug/kg/h | INTRAVENOUS | Status: DC | PRN
  Filled 2021-06-03: qty 5

## 2021-06-03 MED ORDER — MEPERIDINE HCL 25 MG/ML IJ SOLN
INTRAMUSCULAR | Status: AC
  Filled 2021-06-03: qty 1

## 2021-06-03 MED ORDER — MISOPROSTOL 50MCG HALF TABLET: 50.0000 ug | ORAL_TABLET | ORAL | Status: DC | PRN

## 2021-06-03 MED ORDER — SODIUM CHLORIDE 0.9% FLUSH: 3.0000 mL | INTRAVENOUS | Status: DC | PRN

## 2021-06-03 MED ORDER — COCONUT OIL OIL: 1.0000 "application " | TOPICAL_OIL | Status: DC | PRN

## 2021-06-03 MED ORDER — ZOLPIDEM TARTRATE 5 MG PO TABS: 5.0000 mg | ORAL_TABLET | Freq: Every evening | ORAL | Status: DC | PRN

## 2021-06-03 MED ORDER — SODIUM CHLORIDE 0.9 % IR SOLN
Status: DC | PRN
  Administered 2021-06-03: 1000 mL

## 2021-06-03 MED ORDER — ACETAMINOPHEN 500 MG PO TABS
1000.0000 mg | ORAL_TABLET | Freq: Four times a day (QID) | ORAL | Status: DC
  Administered 2021-06-03 – 2021-06-04 (×5): 1000 mg via ORAL
  Filled 2021-06-03 (×5): qty 2

## 2021-06-03 MED ORDER — SODIUM CHLORIDE 0.9 % IV SOLN
INTRAVENOUS | Status: DC | PRN
Start: 2021-06-03 — End: 2021-06-03

## 2021-06-03 MED ORDER — ONDANSETRON HCL 4 MG/2ML IJ SOLN: 4.0000 mg | Freq: Three times a day (TID) | INTRAMUSCULAR | Status: DC | PRN

## 2021-06-03 MED ORDER — DEXMEDETOMIDINE (PRECEDEX) IN NS 20 MCG/5ML (4 MCG/ML) IV SYRINGE
PREFILLED_SYRINGE | INTRAVENOUS | Status: AC
  Filled 2021-06-03: qty 5

## 2021-06-03 MED ORDER — MAGNESIUM HYDROXIDE 400 MG/5ML PO SUSP: 30.0000 mL | ORAL | Status: DC | PRN

## 2021-06-03 MED ORDER — DIPHENHYDRAMINE HCL 50 MG/ML IJ SOLN: 12.5000 mg | INTRAMUSCULAR | Status: DC | PRN

## 2021-06-03 MED ORDER — FENTANYL CITRATE (PF) 100 MCG/2ML IJ SOLN
INTRAMUSCULAR | Status: AC
  Filled 2021-06-03: qty 2

## 2021-06-03 MED ORDER — ONDANSETRON HCL 4 MG/2ML IJ SOLN
INTRAMUSCULAR | Status: DC | PRN
  Administered 2021-06-03: 4 mg via INTRAVENOUS

## 2021-06-03 MED ORDER — MEPERIDINE HCL 25 MG/ML IJ SOLN
INTRAMUSCULAR | Status: DC | PRN
  Administered 2021-06-03 (×2): 12.5 mg via INTRAVENOUS

## 2021-06-03 MED ORDER — LACTATED RINGERS IV SOLN: 500.0000 mL | INTRAVENOUS | Status: DC | PRN

## 2021-06-03 MED ORDER — DIPHENHYDRAMINE HCL 25 MG PO CAPS: 25.0000 mg | ORAL_CAPSULE | ORAL | Status: DC | PRN

## 2021-06-03 MED ORDER — KETOROLAC TROMETHAMINE 30 MG/ML IJ SOLN
INTRAMUSCULAR | Status: AC
  Filled 2021-06-03: qty 1

## 2021-06-03 MED ORDER — ONDANSETRON HCL 4 MG/2ML IJ SOLN
INTRAMUSCULAR | Status: AC
  Filled 2021-06-03: qty 2

## 2021-06-03 MED ORDER — LACTATED RINGERS IV SOLN: INTRAVENOUS | Status: DC

## 2021-06-03 MED ORDER — OXYTOCIN-SODIUM CHLORIDE 30-0.9 UT/500ML-% IV SOLN
INTRAVENOUS | Status: DC | PRN
  Administered 2021-06-03: 30 [IU] via INTRAVENOUS

## 2021-06-03 MED ORDER — PHENYLEPHRINE HCL-NACL 20-0.9 MG/250ML-% IV SOLN
INTRAVENOUS | Status: DC | PRN
Start: 2021-06-03 — End: 2021-06-03
  Administered 2021-06-03: 60 ug/min via INTRAVENOUS

## 2021-06-03 MED ORDER — OXYTOCIN-SODIUM CHLORIDE 30-0.9 UT/500ML-% IV SOLN: INTRAVENOUS | Status: DC | PRN

## 2021-06-03 MED ORDER — CEFAZOLIN SODIUM-DEXTROSE 2-4 GM/100ML-% IV SOLN
2.0000 g | INTRAVENOUS | Status: AC
  Administered 2021-06-03: 2 g via INTRAVENOUS

## 2021-06-03 SURGICAL SUPPLY — 40 items
CHLORAPREP W/TINT 26ML (MISCELLANEOUS) ×2 IMPLANT
CLAMP CORD UMBIL (MISCELLANEOUS) IMPLANT
CLOTH BEACON ORANGE TIMEOUT ST (SAFETY) ×2 IMPLANT
DERMABOND ADHESIVE PROPEN (GAUZE/BANDAGES/DRESSINGS) ×1
DERMABOND ADVANCED (GAUZE/BANDAGES/DRESSINGS)
DERMABOND ADVANCED .7 DNX12 (GAUZE/BANDAGES/DRESSINGS) IMPLANT
DERMABOND ADVANCED .7 DNX6 (GAUZE/BANDAGES/DRESSINGS) IMPLANT
DRAPE C SECTION CLR SCREEN (DRAPES) ×2 IMPLANT
DRSG OPSITE POSTOP 4X10 (GAUZE/BANDAGES/DRESSINGS) ×2 IMPLANT
ELECT REM PT RETURN 9FT ADLT (ELECTROSURGICAL) ×2
ELECTRODE REM PT RTRN 9FT ADLT (ELECTROSURGICAL) ×1 IMPLANT
EXTRACTOR VACUUM M CUP 4 TUBE (SUCTIONS) IMPLANT
GLOVE BIOGEL PI IND STRL 7.0 (GLOVE) ×2 IMPLANT
GLOVE BIOGEL PI INDICATOR 7.0 (GLOVE) ×2
GLOVE SURG SS PI 6.5 STRL IVOR (GLOVE) ×2 IMPLANT
GOWN STRL REUS W/TWL LRG LVL3 (GOWN DISPOSABLE) ×4 IMPLANT
KIT ABG SYR 3ML LUER SLIP (SYRINGE) IMPLANT
NDL HYPO 25X5/8 SAFETYGLIDE (NEEDLE) IMPLANT
NEEDLE HYPO 25X5/8 SAFETYGLIDE (NEEDLE) IMPLANT
NS IRRIG 1000ML POUR BTL (IV SOLUTION) ×2 IMPLANT
PACK C SECTION WH (CUSTOM PROCEDURE TRAY) ×2 IMPLANT
PAD OB MATERNITY 4.3X12.25 (PERSONAL CARE ITEMS) ×2 IMPLANT
RTRCTR C-SECT PINK 25CM LRG (MISCELLANEOUS) ×2 IMPLANT
SPONGE LAP 18X18 X RAY DECT (DISPOSABLE) ×5 IMPLANT
SUT CHROMIC 1 CTX 36 (SUTURE) IMPLANT
SUT CHROMIC 2 0 CT 1 (SUTURE) ×2 IMPLANT
SUT MON AB 4-0 PS1 27 (SUTURE) ×2 IMPLANT
SUT PLAIN 0 NONE (SUTURE) ×1 IMPLANT
SUT PLAIN 1 NONE 54 (SUTURE) IMPLANT
SUT PLAIN 2 0 (SUTURE)
SUT PLAIN 2 0 XLH (SUTURE) IMPLANT
SUT PLAIN ABS 2-0 CT1 27XMFL (SUTURE) IMPLANT
SUT VIC AB 0 CTX 36 (SUTURE) ×1
SUT VIC AB 0 CTX36XBRD ANBCTRL (SUTURE) ×1 IMPLANT
SUT VIC AB 1 CTX 36 (SUTURE) ×3
SUT VIC AB 1 CTX36XBRD ANBCTRL (SUTURE) ×2 IMPLANT
SYR 3ML 25GX5/8 SAFETY (SYRINGE) ×2 IMPLANT
TOWEL OR 17X24 6PK STRL BLUE (TOWEL DISPOSABLE) ×2 IMPLANT
TRAY FOLEY W/BAG SLVR 14FR LF (SET/KITS/TRAYS/PACK) ×2 IMPLANT
WATER STERILE IRR 1000ML POUR (IV SOLUTION) ×2 IMPLANT

## 2021-06-03 NOTE — Progress Notes (Signed)
Joy Ferguson is a 36 y.o. [id number] at [redacted]w[redacted]d by   admitted for induction of labor due to SIckle cell disease/anemia.  Subjective: Patient without complaints.   Objective: BP 108/66    Pulse 90    Temp 97.8 F (36.6 C) (Oral)    Resp 16    Ht 5\' 4"  (1.626 m)    Wt 68.2 kg    LMP 09/15/2020    BMI 25.80 kg/m  No intake/output data recorded. No intake/output data recorded.  FHT:  140 baseline, minimal variability, reactive, recurrent late decelerations. UC:   irregular, every 10  minutes SVE:   Dilation: Closed Effacement (%): 50 Station: -3 Exam by:: [employer] CNM  Labs: Lab Results  Component Value Date   WBC 11.1 (H) 06/03/2021   HGB 7.6 (L) 06/03/2021   HCT 21.8 (L) 06/03/2021   MCV 92.4 06/03/2021   PLT 352 06/03/2021    Assessment / Plan: Patient presented for induction of labor. However with abnormal fetal tracing, therefore will proceed with a cesarean delivery and bilateral tubal ligation.  Patient with anemia with a hemoglobin of 7.6 and will also proceed with a blood transfusion.  - Discussed with patient indications, risks, benefits and alternatives of cesarean section and bilateral tubal ligation via partial salpingectomy including but not limited to risks of bleeding, infection and damage to organs, small risk of tubal ligation failure.  I discussed with patient risks, benefits and alternatives of blood transfusion including risks of infection (through viruses, bacteria and parasites in donor blood), allergic, hemolytic and other immunologic transfusion reactions.  She expressed understanding of all this and desires to proceed with the surgeries and blood transfusion. Archie Endo. MD.  06/03/2021, 4:13 AM

## 2021-06-03 NOTE — Op Note (Addendum)
Patient: Joy Ferguson DOB: [birth year] MRN:  [id number]  DATE OF SURGERY:  06/03/2021  PREOP DIAGNOSIS:  1. [redacted] week EGA intrauterine pregnancy  2. Sickle cell disease and anemia with a hemoglobin of 7.6. 3. Abnormal fetal heart tracing with minimal variability and recurrent late decelerations that continued despite intrauterine resuscitation.  4. Multiparous patient, desires sterilization.  5. Gestational diabetes mellitus, diet controlled.  POSTOP DIAGNOSIS: Same as above.  PROCEDURES:  Urgent Primary low uterine segment transverse cesarean section via Pfannenstiel incision.   Postpartum bilateral tubal ligation.    SURGEON: DR.  Asa Fath  ASSISTANT:Emily Slaughterbeck, CNM.   ATTENDING ATTESTATION: I was present and scrubbed and performed the procedure and the assistant was required due to the complexity of anatomy.   ANESTHESIA: Spinal  COMPLICATIONS: Postpartum hemorrhage with an EBL of 1118 cc.  Patient received IV pitocin and tranexamic acid intra-operatively for this.   FINDINGS: Viable female infant in cephalic presentation, DOA, weight pending, Apgar scores of 3 and 5. Normal uterus and fallopian tubes and ovaries bilaterally.    EBL:  1118 cc  IV FLUID:  3000 cc LR   URINE OUTPUT: 450 cc clear urine  INDICATIONS: 36 y/o G6 P1132 at [redacted] weeks EGA who presented for labor induction for sickle cell disease, but baby was found to have an abnormal fetal heart tracing and it was recommended to proceed with a cesarean delivery. She also desired sterilization.  Because of anemia a blood transfusion was recommended.    PROCEDURE:  Informed consent was obtained from the patient for the surgeries and blood transfusion.  She was taken to the operating room where her spinal anesthesia was found to be adequate. She was prepped and draped in the usual sterile fashion and a Foley catheter was placed. She received 2 g of IV Ancef preoperatively. 2 units packed red blood cells   transfusion was started at the beginning of the surgery.  A Pfannenstiel incision was made with the scalpel and the incision extended through the subcutaneous layer and also the fascia with the bovie. Small perforators in the subcutaneous layer were contained with the Bovie. The fascia was nicked in the midline and then was further separated from the rectus muscles bilaterally using Mayo scissors. Kochers were placed inferiorly and then superiorly to allow further separation of fascia from the rectus muscles.  The peritoneal cavity was entered bluntly with the fingers. The Alexis retractor was placed in. The vesicouterine fold was developed with Metzenbaum scissors.   The uterus was incised with a scalpel and the incision extended bluntly bilaterally with fingers. Moderately meconium stained amniotic fluid was noted.  The head then the rest of the body was then delivered with abdominal pressure.  She delivered a viable female infant who was quickly handed over to the NICU team available in the OR.  Cord pH (there were small arterial vessels, therefore little arterial blood collected, venous specimen also collected) and cord blood was collected.    The uterus was not exteriorized.  The edges of the uterus was grasped with Allis clamps and also T. Clamps. The placenta was delivered with gentle traction on the umbilical cord. The uterus was cleared of clots and debris with a lap.  The incision uterine incision was closed with #1 Vicryl in a running locked stitch. An imbricating layer of same stitch was placed over the initial closure.  A small area that bled on the left side was contained with figure of 8 stitch.   Irrigation was  applied and suctioned out. Excellent hemostasis was noted over the incision.  Uterus was exteriorized and attention turned to the tubes.  The left  fallopian tube was identified and followed up to the fimbria end.    A 2 cm portion of the tube was doubly isolated with 2-0 plain ties about  3 cm from the cornual edge.  The mesosalpinx in the isolated tubal portion was entered and then the isolated portion of tube excised with metzenbaum scissors.  The edges of the cut tubes were further trimmed and excellent hemostasis was noted.  A similar procedure was performed on the right side.  The portions of the right and left tubes were handed over as specimen to pathology.  Excellent hemostasis was noted on both pedicles.  The uterus was then carefully returned into the abdomen taking care not to disrupt the tubal ligation.  The uterus incision was noted to be hemostatic.     The muscles and peritoneum were then reapproximated using chromic suture.  Fascia was closed using 0 Vicryl in a running stitch. The subcutaneous layer was irrigated and suctioned out. Small perforators were contained with the bovie.  The subcutaneous layer was closed over using 1-0 plain in interrupted stitches. The skin was closed using 4-0 Monocryl. Dermabond was applied. Honeycomb was then applied. The patient was then cleaned and she was taken to the recovery room in stable condition. The neonate was also taken to the nursery in stable condition.   SPECIMENS:  Placenta to pathology. Umbilical arterial and venous cord pH and cord blood  DISPOSITION: MOTHER TO PACU, STABLE. BABY TO NICU IN GUARDED CONDITION.  Waymon Amato, MD. 06/03/2021 7:23 AM.

## 2021-06-03 NOTE — Anesthesia Procedure Notes (Signed)
Spinal  Patient location during procedure: OR Start time: 06/03/2021 4:16 AM End time: 06/03/2021 4:19 AM Reason for block: surgical anesthesia Staffing Performed: anesthesiologist  Anesthesiologist: Josephine Igo, MD Preanesthetic Checklist Completed: patient identified, IV checked, site marked, risks and benefits discussed, surgical consent, monitors and equipment checked, pre-op evaluation and timeout performed Spinal Block Patient position: sitting Prep: DuraPrep and site prepped and draped Patient monitoring: heart rate, cardiac monitor, continuous pulse ox and blood pressure Approach: midline Location: L3-4 Injection technique: single-shot Needle Needle type: Pencan  Needle gauge: 24 G Needle length: 9 cm Needle insertion depth: 5 cm Assessment Sensory level: T4 Events: CSF return Additional Notes Patient tolerated procedure well. Adequate sensory level.

## 2021-06-03 NOTE — Anesthesia Postprocedure Evaluation (Signed)
Anesthesia Post Note  Patient: Joy Ferguson  Procedure(s) Performed: CESAREAN SECTION     Patient location during evaluation: PACU Anesthesia Type: Spinal Level of consciousness: oriented and awake and alert Pain management: pain level controlled Vital Signs Assessment: post-procedure vital signs reviewed and stable Respiratory status: spontaneous breathing, respiratory function stable and nonlabored ventilation Cardiovascular status: blood pressure returned to baseline and stable Postop Assessment: no headache, no backache, no apparent nausea or vomiting and spinal receding Anesthetic complications: no   No notable events documented.  Last Vitals:  Vitals:   06/03/21 0629 06/03/21 0630  BP: 103/63 103/63  Pulse: 83   Resp: 18 19  Temp: 36.5 C   SpO2: 100% 100%    Last Pain:  Vitals:   06/03/21 0629  TempSrc:   PainSc: 0-No pain   Pain Goal:      LLE Sensation: No sensation (absent) (06/03/21 0629)   RLE Sensation: No sensation (absent) (06/03/21 7035)     Epidural/Spinal Function Cutaneous sensation: No Sensation (06/03/21 0629), Patient able to flex knees: No (06/03/21 0629), Patient able to lift hips off bed: No (06/03/21 0629), Back pain beyond tenderness at insertion site: No (06/03/21 0629), Progressively worsening motor and/or sensory loss: No (06/03/21 0629), Bowel and/or bladder incontinence post epidural: No (06/03/21 0629)  [name] A.

## 2021-06-03 NOTE — Progress Notes (Signed)
Patient asked several times if she was going to breast feed and her desire to start pumping. Pt has declined being set up with the pump and is unsure if she wants to breast feed. Pt educated and encouraged to ring her call bell if she changes her mind and would like assistance. Toya Smothers, RN

## 2021-06-03 NOTE — H&P (Signed)
OB ADMISSION/ HISTORY & PHYSICAL:  Admission Date: 06/03/2021  1:57 AM  Admit Diagnosis: IOL for sickle cell disease, A1GDM  Joy Ferguson is a 36 y.o. female [id number] [redacted]w[redacted]d presenting for IOL for sickle cell . Endorses active FM, denies LOF and vaginal bleeding.   History of current pregnancy: [id number]   Primary OB Provider: [name] Patient entered care with [name] at [redacted]w[redacted]d wks.   EDC 06/10/2021 by [redacted]w[redacted]d [name] with unsure LM.    Anatomy scan:  23 wks, complete w/ anterior placenta.   Antenatal testing: for  sickle cell- hemoglobin disease started at 32 weeks Last evaluation: 38  wks  Significant prenatal events:   Sickle cell- hemoglobin disease (followed by [facility] and [facility] prenatally) Anemia AMA A1GDM   Patient Active Problem List   Diagnosis Date Noted   Encounter for induction of labor 06/03/2021   Late prenatal care 04/12/2021   Anemia 04/12/2021   Hip pain 04/12/2021   Perineal pain 04/12/2021   Gestational diabetes mellitus 04/06/2021   Maternal varicella, non-immune 03/04/2021   Advanced maternal age in multigravida 12/12/2020   Vaginal delivery 04/12/2018   Gestational proteinuria 03/14/2018   Polyhydramnios 03/13/2018   Alpha thalassemia trait 12/06/2017   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, unspecified 12/05/2017   History of miscarriage 12/05/2017   Language difficulty 12/05/2017   Thrombocytosis 12/05/2017   Thyroid nodule 10/17/2017   History of premature delivery 10/16/2017   Pregnant 10/16/2017   Family history of consanguinity 10/12/2017   [redacted] weeks gestation of pregnancy    Abdominal pain, chronic, epigastric 09/28/2015   Vitamin D deficiency 02/10/2015   Acute chest syndrome due to sickle-cell disease--seen at [facility] 12/2014 01/07/2015   Preterm delivery 01/07/2015   Pneumonia of left lung due to infectious organism 01/01/2015   Iron deficiency anemia 01/01/2015   Large liver 01/01/2015   Status post normal delivery  01/01/2015   Sickle cell anemia ([name]) 09/24/2014   Hb-SS disease with acute chest syndrome ([name]) 1986-01-05    Prenatal Labs: ABO, Rh: --/--/O POS (01/20 3295) Antibody: NEG (01/20 0237) Rubella: Immune (08/01 0000)  RPR: Nonreactive (08/01 0000)  HBsAg: Negative (08/01 0000)  HIV: Non-reactive (08/01 0000)  GTT: 174 GBS: Negative/-- (12/29 0000)  GC/CHL: neg Genetics: low risk, female Tdap/influenza vaccines: declined   OB History  Gravida Para Term Preterm AB Living  6 3 2 1 3 3   SAB IAB Ectopic Multiple Live Births  3 0 0 0 3    # Outcome Date GA Lbr Len/2nd Weight Sex Delivery Anes PTL Lv  6 Term 06/03/21 [redacted]w[redacted]d  2400 g F CS-LTranv Spinal  LIV  5 Term 04/12/18 [redacted]w[redacted]d 02:53 / 00:40 2410 g M Vag-Spont EPI  LIV  4 SAB 06/26/17 [redacted]w[redacted]d         3 Preterm 2016 [redacted]w[redacted]d       LIV  2 SAB 2012          1 SAB 2012            Medical / Surgical History: Past medical history:  Past Medical History:  Diagnosis Date   Anemia    Female circumcision    GERD (gastroesophageal reflux disease)    takes Zantac with relief   Gestational proteinuria    Sickle cell anemia (HCC)    Thrombocytosis    Thyroid nodule     Past surgical history:  Past Surgical History:  Procedure Laterality Date   VAGINA SURGERY     Family  History:  Family History  Problem Relation Age of Onset   Hypertension Father    Diabetes Paternal Grandmother    Hypertension Paternal Grandmother     Social History:  reports that she has never smoked. She has never used smokeless tobacco. She reports that she does not drink alcohol and does not use drugs.  Allergies: Patient has no known allergies.   Current Medications at time of admission:  Prior to Admission medications   Medication Sig Start Date End Date Taking? Authorizing Provider  aspirin 81 MG EC tablet Adult Low Dose Aspirin 81 mg tablet,delayed release  Take 1 tablet every day by oral route. 01/18/21  Yes [provider]  folic acid  (FOLVITE) 1 MG tablet Take 4 mg by mouth daily. 03/30/21  Yes [provider]  Prenatal Vit-Fe Fumarate-FA (PRENATAL MULTIVITAMIN) TABS tablet Take 1 tablet by mouth daily at 12 noon.   Yes [provider]  hydroxyprogesterone caproate (MAKENA) 250 mg/mL OIL injection Compounded 17P Hydroprogesterone Caproate 250mg /ml  Hydroxyprogesterone Caproate 250mg /ml  1mg  injection Patient not taking: Reported on 05/20/2021    [provider]    Review of Systems: Constitutional: Negative   HENT: Negative   Eyes: Negative   Respiratory: Negative   Cardiovascular: Negative   Gastrointestinal: Negative  Genitourinary: neg for bloody show, neg for LOF   Musculoskeletal: Negative   Skin: Negative   Neurological: Negative   Endo/Heme/Allergies: Negative   Psychiatric/Behavioral: Negative    Physical Exam: VS: Blood pressure (!) 104/54, pulse 85, temperature 97.8 F (36.6 C), temperature source Oral, resp. rate 16, height 5\' 4"  (1.626 m), weight 68.2 kg, last menstrual period 09/15/2020, SpO2 98 %, unknown if currently breastfeeding. AAO x3, no signs of distress Cardiovascular: RRR Respiratory: Lung fields clear to ausculation GU/GI: Abdomen gravid, non-tender, non-distended, active FM, vertex, EFW 7lbs per Leopold's Extremities: no edema, negative for pain, tenderness, and cords  Cervical exam:Dilation: Closed Effacement (%): 50 Station: -3 Exam by:: Ragan Reale CNM FHR: baseline rate 150 / variability minimal / accelerations none / variable, prolonged decelerations  TOCO: occasional    Prenatal Transfer Tool  Maternal Diabetes: Yes:  Diabetes Type:  Diet controlled Genetic Screening: Normal Maternal Ultrasounds/Referrals: Other: Sickle cell disease Fetal Ultrasounds or other Referrals:  None Maternal Substance Abuse:  No Significant Maternal Medications:  None Significant Maternal Lab Results: Group B Strep negative    Assessment: Joy Ferguson [redacted]w[redacted]d here for  IOL for sickle cell disease. Cat 2 tracing on admission not improved after fluid bolus and position changes. Consulted with Dr. Alesia Richards who is now in route to the hospital for probable caesarean section.   A1GDM managed well with diet and exercise  Not currently laboring.  FHR category 2 GBS negative Check blood glucose q 4 hours   Plan:  Admit to L&D Routine admission orders Check blood glucose q 4 hours Anticipate caesarean section with bilateral tubal ligation.   Dr Alesia Richards notified of admission and en route to hospital  Kathalene Frames MSN, CNM 06/03/2021 5:50 AM

## 2021-06-03 NOTE — Transfer of Care (Signed)
Immediate Anesthesia Transfer of Care Note  Patient: Joy Ferguson  Procedure(s) Performed: CESAREAN SECTION  Patient Location: PACU  Anesthesia Type:Spinal  Level of Consciousness: awake, alert  and oriented  Airway & Oxygen Therapy: Patient Spontanous Breathing  Post-op Assessment: Report given to RN and Post -op Vital signs reviewed and stable  Post vital signs: Reviewed and stable  Last Vitals:  Vitals Value Taken Time  BP    Temp    Pulse 75 06/03/21 0620  Resp 19 06/03/21 0620  SpO2 100 % 06/03/21 0620  Vitals shown include unvalidated device data.  Last Pain:  Vitals:   06/03/21 0411  TempSrc:   PainSc: 0-No pain         Complications: No notable events documented.

## 2021-06-03 NOTE — Anesthesia Preprocedure Evaluation (Signed)
Anesthesia Evaluation  Patient identified by MRN, date of birth, ID band Patient awake    Reviewed: Allergy & Precautions, NPO status , Patient's Chart, lab work & pertinent test results  Airway Mallampati: II  TM Distance: >3 FB Neck ROM: Full    Dental no notable dental hx. (+) Teeth Intact, Dental Advisory Given   Pulmonary pneumonia, resolved,    Pulmonary exam normal breath sounds clear to auscultation       Cardiovascular negative cardio ROS Normal cardiovascular exam Rhythm:Regular Rate:Normal     Neuro/Psych negative neurological ROS  negative psych ROS   GI/Hepatic Neg liver ROS, GERD  Medicated and Controlled,  Endo/Other  diabetes, Well Controlled, Gestational  Renal/GU negative Renal ROS  negative genitourinary   Musculoskeletal negative musculoskeletal ROS (+)   Abdominal   Peds  Hematology  (+) anemia ,   Anesthesia Other Findings   Reproductive/Obstetrics (+) Pregnancy Fetal intolerance to labor                             Anesthesia Physical Anesthesia Plan  ASA: 2 and emergent  Anesthesia Plan: Spinal   Post-op Pain Management:    Induction: Intravenous  PONV Risk Score and Plan: 4 or greater and Treatment may vary due to age or medical condition and Scopolamine patch - Pre-op  Airway Management Planned: Natural Airway  Additional Equipment:   Intra-op Plan:   Post-operative Plan:   Informed Consent: I have reviewed the patients History and Physical, chart, labs and discussed the procedure including the risks, benefits and alternatives for the proposed anesthesia with the patient or authorized representative who has indicated his/her understanding and acceptance.     Dental advisory given  Plan Discussed with: CRNA and Anesthesiologist  Anesthesia Plan Comments:         Anesthesia Quick Evaluation

## 2021-06-04 LAB — TYPE AND SCREEN
ABO/RH(D): O POS
Antibody Screen: NEGATIVE
Unit division: 0
Unit division: 0

## 2021-06-04 LAB — CBC
HCT: 22.1 % — ABNORMAL LOW (ref 36.0–46.0)
Hemoglobin: 7.5 g/dL — ABNORMAL LOW (ref 12.0–15.0)
MCH: 30.7 pg (ref 26.0–34.0)
MCHC: 33.9 g/dL (ref 30.0–36.0)
MCV: 90.6 fL (ref 80.0–100.0)
Platelets: 314 10*3/uL (ref 150–400)
RBC: 2.44 MIL/uL — ABNORMAL LOW (ref 3.87–5.11)
RDW: 22 % — ABNORMAL HIGH (ref 11.5–15.5)
WBC: 15.3 10*3/uL — ABNORMAL HIGH (ref 4.0–10.5)
nRBC: 32.9 % — ABNORMAL HIGH (ref 0.0–0.2)

## 2021-06-04 LAB — BPAM RBC
Blood Product Expiration Date: 202302122359
Blood Product Expiration Date: 202302122359
ISSUE DATE / TIME: 202301200405
ISSUE DATE / TIME: 202301200405
Unit Type and Rh: 5100
Unit Type and Rh: 5100

## 2021-06-04 MED ORDER — OXYCODONE-ACETAMINOPHEN 5-325 MG PO TABS: 1.0000 | ORAL_TABLET | ORAL | 0 refills | Status: AC | PRN

## 2021-06-04 MED ORDER — SENNOSIDES-DOCUSATE SODIUM 8.6-50 MG PO TABS: 2.0000 | ORAL_TABLET | Freq: Every evening | ORAL | 3 refills | Status: AC | PRN

## 2021-06-04 MED ORDER — IBUPROFEN 600 MG PO TABS: 600.0000 mg | ORAL_TABLET | Freq: Four times a day (QID) | ORAL | 3 refills | Status: AC | PRN

## 2021-06-04 NOTE — Discharge Summary (Signed)
[facility] Discharge Summary   Patient Name:   Joy Ferguson DOB:     [birth year] MRN:     [id number]  Date of Admission:   06/03/2021 Date of Discharge:  06/04/2021  Admitting diagnosis:    Encounter for induction of labor [Z34.90] Principal Problem:   S/P cesarean section Active Problems:   Encounter for induction of labor   Maternal sickle cell anemia complicating pregnancy in third trimester ([place]) A1 Gestational diabetes Term Pregnancy Delivered    Discharge diagnosis:   Encounter for induction of labor [Z34.90] Principal Problem:   S/P cesarean section Active Problems:   Encounter for induction of labor   Maternal sickle cell anemia complicating pregnancy in third trimester ([place]) Term Pregnancy Delivered, GDM A1, and PPH        Additional problems: acute blood loss anemia                                          Post partum procedures:blood transfusion and tubal during cesarean Augmentation: N/A Complications: [id number]  Hospital course: Induction of Labor With Cesarean Section   36 y.o. yo [phone] at 39w0dwas admitted to the hospital 06/03/2021 for induction of labor. Upon admission the NST was non-reassuring and the patient went for cesarean section due to Non-Reassuring FHR. Delivery details are as follows: Membrane Rupture Time/Date: 4:44 AM ,06/03/2021   Delivery Method:C-Section, Low Transverse  Details of operation can be found in separate operative Note.  Patient had an uncomplicated postpartum course. She is ambulating, tolerating a regular diet, passing flatus, and urinating well.  Patient is discharged home in stable condition on 06/04/21.      Newborn Data: Birth date:06/03/2021  Birth time:4:45 AM  Gender:Female  Living status:Living  Apgars:3 ,5  Weight:2400 g                                Magnesium Sulfate received: No BMZ received: No Rhophylac:No MMR:No T-DaP: declined Flu: No Transfusion:Yes                                                                Type of Delivery:  Primary cesarean section Delivering Provider: KAlesia Richards EMA  Date of Delivery:  06/03/21  Newborn Data:               Baby Feeding:   Bottle Disposition:   Transferred to [facility]  Physical Exam:   Vitals:   06/03/21 1939 06/03/21 2301 06/04/21 0839 06/04/21 0840  BP: (!) 88/51 (!) 97/58 (!) 100/59   Pulse: 69 76 68   Resp: '16 16 16   ' Temp: 98.1 F (36.7 C) 98.1 F (36.7 C) 98.1 F (36.7 C)   TempSrc: Oral Oral Oral   SpO2: 95% 94% (!) 89% 93%  Weight:      Height:       General: alert, cooperative, and no distress Lochia: appropriate Uterine Fundus: firm Incision: Healing well with no significant drainage, No significant erythema, Dressing is clean, dry, and intact DVT Evaluation: No evidence of DVT seen on physical exam. Negative Homan's sign. No cords or calf  tenderness.  Labs: Lab Results  Component Value Date   WBC 15.3 (H) 06/04/2021   HGB 7.5 (L) 06/04/2021   HCT 22.1 (L) 06/04/2021   MCV 90.6 06/04/2021   PLT 314 06/04/2021   CMP Latest Ref Rng & Units 04/12/2021  Glucose 70 - 99 mg/dL 91  BUN 6 - 20 mg/dL 4(L)  Creatinine 0.57 - 1.00 mg/dL 0.35(L)  Sodium 134 - 144 mmol/L 135  Potassium 3.5 - 5.2 mmol/L 4.4  Chloride 96 - 106 mmol/L 102  CO2 20 - 29 mmol/L 19(L)  Calcium 8.7 - 10.2 mg/dL 9.0  Total Protein 6.0 - 8.5 g/dL 6.2  Total Bilirubin 0.0 - 1.2 mg/dL 1.5(H)  Alkaline Phos 44 - 121 IU/L 142(H)  AST 0 - 40 IU/L 68(H)  ALT 0 - 32 IU/L 73(H)    Discharge instruction: per After Visit Summary and "Baby and Me Booklet".  After Visit Meds:  Allergies as of 06/04/2021   No Known Allergies      Medication List     STOP taking these medications    aspirin 81 MG EC tablet   hydroxyprogesterone caproate 250 mg/mL Oil injection Commonly known as: MAKENA       TAKE these medications    folic acid 1 MG tablet Commonly known as: FOLVITE Take 4 mg by mouth daily.   ibuprofen 600 MG  tablet Commonly known as: ADVIL Take 1 tablet (600 mg total) by mouth every 6 (six) hours as needed for moderate pain or cramping.   oxyCODONE-acetaminophen 5-325 MG tablet Commonly known as: Percocet Take 1-2 tablets by mouth every 4 (four) hours as needed for severe pain.   prenatal multivitamin Tabs tablet Take 1 tablet by mouth daily at 12 noon.   senna-docusate 8.6-50 MG tablet Commonly known as: Senokot-S Take 2 tablets by mouth at bedtime as needed for mild constipation.        Diet: routine diet  Activity: Advance as tolerated. Pelvic rest for 6 weeks.   Outpatient follow up:1 week Follow up Appt: Future Appointments  Date Time Provider [place]  11/08/2021  8:40 AM Dorena Dew, FNP [facility] None   Follow up visit: No follow-ups on file.  Postpartum contraception: Tubal Ligation done  06/04/2021 Sanjuana Kava, MD

## 2021-06-04 NOTE — Lactation Note (Addendum)
Lactation Consultation Note  Patient Name: Joy Ferguson [id number] Date: 06/04/2021 Reason for consult: Initial assessment;NICU baby;Term;Maternal discharge (baby is in the NICU @ [facility]) Age:36 y.o.  Patient declined the use of interpreter services, she speaks [place].  Visited with mom of 28 hours old FT NICU female, she's a P3; baby had to be transported to [place] soon after birth, mom is getting discharge today and per report of OB Specialty care Rns she has declined to pump during her stay. LC assisted mom with hand expression (colostrum noted) and the initiation of pumping, she doesn't have a pump at home, but she got [place] during the pregnancy, [facility] referral faxed to [facility].  She's getting discharged today, reviewed discharge education, pumping schedule and lactogenesis II. Explained to mom the importance of early and consistent pumping to protect her supply. Family was offered a [facility] loaner pump since mom doesn't have a pump at home but FOB said he'll check to make sure he has enough cash to pay for the deposit. Asked family to call NICU LC if they decide to do a [facility] loaner pump.  Maternal Data Has patient been taught Hand Expression?: Yes Does the patient have breastfeeding experience prior to this delivery?: Yes How long did the patient breastfeed?: 2 months (pumping and bottle feeding due to NICU stay)  Feeding Mother's Current Feeding Choice: Breast Milk  Lactation Tools Discussed/Used Tools: Pump;Flanges Flange Size: 24 Breast pump type: Double-Electric Breast Pump Pump Education: Setup, frequency, and cleaning;Milk Storage Reason for Pumping: NICU infant Pumping frequency: q 3 hours (recommended) Pumped volume:  (droplets)  Interventions  DEBP, education, NICU booklet, LC brochure  Plan of care  Encouraged mom to start pumping consistently every 3 hours, at least 8 pumping sessions/24 hours Hand expression and breast massage were also encouraged prior pumping Mom will  take all her pump pieces to use it with her WIC pump  FOB present and supportive. All questions and concerns answered, family to contact NICU LC services PRN.  Discharge Discharge Education: Engorgement and breast care Pump: DEBP WIC Program: Yes  Consult Status Consult Status: Follow-up Date: 06/04/21 Follow-up type: In-patient   Joy Ferguson 06/04/2021, 12:27 PM

## 2021-06-04 NOTE — Plan of Care (Signed)
Problem: Nutrition: Goal: Adequate nutrition will be maintained Outcome: Completed/Met   Problem: Coping: Goal: Level of anxiety will decrease Outcome: Completed/Met   Problem: Elimination: Goal: Will not experience complications related to urinary retention Outcome: Completed/Met   Problem: Education: Goal: Knowledge of condition will improve Outcome: Completed/Met   Problem: Coping: Goal: Ability to identify and utilize available resources and services will improve Outcome: Completed/Met   Problem: Role Relationship: Goal: Ability to demonstrate positive interaction with newborn will improve Outcome: Completed/Met

## 2021-06-06 ENCOUNTER — Telehealth: Payer: Self-pay

## 2021-06-06 LAB — SURGICAL PATHOLOGY

## 2021-06-06 NOTE — Telephone Encounter (Signed)
Transition Care Management Unsuccessful Follow-up Telephone Call  Date of discharge and from where:  06/04/2021-[facility]   Attempts:  1st Attempt  Reason for unsuccessful TCM follow-up call:  Left voice message

## 2021-06-07 NOTE — Telephone Encounter (Signed)
Transition Care Management Follow-up Telephone Call Date of discharge and from where: 06/04/2021 from [facility] How have you been since you were released from the hospital? Pt stated that she is feeling well. Pt did is calling OBGYN to discuss the chest pain. Pt stated that the pain is not new, denies sob or doe.  Any questions or concerns? No  Items Reviewed: Did the pt receive and understand the discharge instructions provided? Yes  Medications obtained and verified? Yes  Other? No  Any new allergies since your discharge? No  Dietary orders reviewed? No Do you have support at home? Yes   Functional Questionnaire: (I = Independent and D = Dependent) ADLs: I  Bathing/Dressing- I  Meal Prep- I  Eating- I  Maintaining continence- I  Transferring/Ambulation- I  Managing Meds- I  Follow up appointments reviewed:  PCP Hospital f/u appt confirmed? No   Specialist Hospital f/u appt confirmed? No  Calling OBGYN for follow up appt.  Are transportation arrangements needed? No  If their condition worsens, is the pt aware to call PCP or go to the Emergency Dept.? Yes Was the patient provided with contact information for the PCP's office or ED? Yes Was to pt encouraged to call back with questions or concerns? Yes

## 2021-06-18 ENCOUNTER — Telehealth (HOSPITAL_COMMUNITY): Payer: Self-pay | Admitting: *Deleted

## 2021-06-18 NOTE — Telephone Encounter (Signed)
Hospital discharge phone call completed with Language Line interpreter, Marinus Maw [phone]. Patient stated that she is having "severe chest pain" and that "sometimes" she has shortness of breath. RN instructed patient to go to the ER or to call 911. Patient stated that her husband is home with her and can take her to ER. Patient verbalized understanding. RN ended phone call - did not complete EPDS at this time. Erline Levine, RN, 06/18/21, 1001.

## 2021-06-29 ENCOUNTER — Other Ambulatory Visit: Payer: Self-pay | Admitting: Cardiovascular Disease

## 2021-06-29 ENCOUNTER — Ambulatory Visit
Admission: RE | Admit: 2021-06-29 | Discharge: 2021-06-29 | Disposition: A | Discharge status: Home / Self Care | Payer: Medicaid Other | Source: Ambulatory Visit | Attending: Cardiovascular Disease | Admitting: Cardiovascular Disease

## 2021-06-29 DIAGNOSIS — R079 Chest pain, unspecified: Secondary | ICD-10-CM

## 2021-11-08 ENCOUNTER — Ambulatory Visit: Payer: Medicaid Other | Admitting: Family Medicine

## 2022-01-11 DIAGNOSIS — E559 Vitamin D deficiency, unspecified: Secondary | ICD-10-CM | POA: Diagnosis not present

## 2022-01-11 DIAGNOSIS — E041 Nontoxic single thyroid nodule: Secondary | ICD-10-CM | POA: Diagnosis not present

## 2022-01-11 DIAGNOSIS — Z0000 Encounter for general adult medical examination without abnormal findings: Secondary | ICD-10-CM | POA: Diagnosis not present

## 2022-01-13 DIAGNOSIS — E042 Nontoxic multinodular goiter: Secondary | ICD-10-CM | POA: Diagnosis not present

## 2022-01-13 DIAGNOSIS — E041 Nontoxic single thyroid nodule: Secondary | ICD-10-CM | POA: Diagnosis not present

## 2022-01-19 DIAGNOSIS — R17 Unspecified jaundice: Secondary | ICD-10-CM | POA: Diagnosis not present

## 2022-08-07 DIAGNOSIS — K429 Umbilical hernia without obstruction or gangrene: Secondary | ICD-10-CM | POA: Diagnosis not present

## 2022-08-07 DIAGNOSIS — N96 Recurrent pregnancy loss: Secondary | ICD-10-CM | POA: Diagnosis not present

## 2022-08-07 DIAGNOSIS — Z1330 Encounter for screening examination for mental health and behavioral disorders, unspecified: Secondary | ICD-10-CM | POA: Diagnosis not present

## 2022-08-07 DIAGNOSIS — K219 Gastro-esophageal reflux disease without esophagitis: Secondary | ICD-10-CM | POA: Diagnosis not present

## 2022-08-07 DIAGNOSIS — D571 Sickle-cell disease without crisis: Secondary | ICD-10-CM | POA: Diagnosis not present

## 2022-08-07 DIAGNOSIS — E041 Nontoxic single thyroid nodule: Secondary | ICD-10-CM | POA: Diagnosis not present

## 2022-08-07 DIAGNOSIS — D649 Anemia, unspecified: Secondary | ICD-10-CM | POA: Diagnosis not present

## 2022-08-07 DIAGNOSIS — K2900 Acute gastritis without bleeding: Secondary | ICD-10-CM | POA: Diagnosis not present

## 2022-08-14 DIAGNOSIS — K2900 Acute gastritis without bleeding: Secondary | ICD-10-CM | POA: Diagnosis not present

## 2022-08-25 DIAGNOSIS — D571 Sickle-cell disease without crisis: Secondary | ICD-10-CM | POA: Diagnosis not present

## 2022-08-25 DIAGNOSIS — E041 Nontoxic single thyroid nodule: Secondary | ICD-10-CM | POA: Diagnosis not present

## 2022-08-25 DIAGNOSIS — K2970 Gastritis, unspecified, without bleeding: Secondary | ICD-10-CM | POA: Diagnosis not present

## 2022-08-25 DIAGNOSIS — B9681 Helicobacter pylori [H. pylori] as the cause of diseases classified elsewhere: Secondary | ICD-10-CM | POA: Diagnosis not present

## 2022-08-29 DIAGNOSIS — E042 Nontoxic multinodular goiter: Secondary | ICD-10-CM | POA: Diagnosis not present

## 2022-09-28 DIAGNOSIS — R809 Proteinuria, unspecified: Secondary | ICD-10-CM | POA: Diagnosis not present

## 2022-09-28 DIAGNOSIS — D564 Hereditary persistence of fetal hemoglobin [HPFH]: Secondary | ICD-10-CM | POA: Diagnosis not present

## 2022-09-28 DIAGNOSIS — D571 Sickle-cell disease without crisis: Secondary | ICD-10-CM | POA: Diagnosis not present

## 2022-09-28 DIAGNOSIS — R1013 Epigastric pain: Secondary | ICD-10-CM | POA: Diagnosis not present

## 2023-11-07 ENCOUNTER — Telehealth: Payer: Self-pay | Admitting: *Deleted

## 2023-11-07 DIAGNOSIS — D5700 Hb-SS disease with crisis, unspecified: Secondary | ICD-10-CM

## 2023-11-07 NOTE — Progress Notes (Signed)
 Complex Care Management Note  Care Guide Note 11/07/2023 Name: Annaya Bangert MRN: [id number] DOB: 1985/09/05  Etrulia Alkhatib is a 37 y.o. year old female who sees No primary care provider on file. for primary care. I reached out to Wally Spinello by phone today to offer complex care management services.  Ms. Castro was given information about Complex Care Management services today including:   The Complex Care Management services include support from the care team which includes your Nurse Care Manager, Clinical Social Worker, or Pharmacist.  The Complex Care Management team is here to help remove barriers to the health concerns and goals most important to you. Complex Care Management services are voluntary, and the patient may decline or stop services at any time by request to their care team member.   Complex Care Management Consent Status: Patient did not agree to participate in complex care management services at this time.  Follow up plan:  pt pcp not with [place] - pt aware to update health plan   Encounter Outcome:  Patient Refused  Thedford Franks, CMA [facility]  [facility], [facility] Guide Direct Dial: [phone]  Fax: [phone] Website: [email or web address]
